# Patient Record
Sex: Female | Born: 1960
Health system: Southern US, Community
[De-identification: ages and names within clinical notes are randomized; demographics above are authoritative.]

## PROBLEM LIST (undated history)

## (undated) DIAGNOSIS — B019 Varicella without complication: Secondary | ICD-10-CM

## (undated) DIAGNOSIS — L859 Epidermal thickening, unspecified: Secondary | ICD-10-CM

## (undated) DIAGNOSIS — K449 Diaphragmatic hernia without obstruction or gangrene: Secondary | ICD-10-CM

## (undated) DIAGNOSIS — F329 Major depressive disorder, single episode, unspecified: Secondary | ICD-10-CM

## (undated) DIAGNOSIS — F411 Generalized anxiety disorder: Secondary | ICD-10-CM

## (undated) DIAGNOSIS — N390 Urinary tract infection, site not specified: Secondary | ICD-10-CM

## (undated) DIAGNOSIS — J349 Unspecified disorder of nose and nasal sinuses: Secondary | ICD-10-CM

## (undated) DIAGNOSIS — K635 Polyp of colon: Secondary | ICD-10-CM

## (undated) DIAGNOSIS — K219 Gastro-esophageal reflux disease without esophagitis: Secondary | ICD-10-CM

## (undated) DIAGNOSIS — T7840XA Allergy, unspecified, initial encounter: Secondary | ICD-10-CM

## (undated) DIAGNOSIS — J45909 Unspecified asthma, uncomplicated: Secondary | ICD-10-CM

## (undated) DIAGNOSIS — K5792 Diverticulitis of intestine, part unspecified, without perforation or abscess without bleeding: Secondary | ICD-10-CM

## (undated) DIAGNOSIS — J302 Other seasonal allergic rhinitis: Secondary | ICD-10-CM

## (undated) DIAGNOSIS — J301 Allergic rhinitis due to pollen: Secondary | ICD-10-CM

## (undated) DIAGNOSIS — N289 Disorder of kidney and ureter, unspecified: Secondary | ICD-10-CM

## (undated) DIAGNOSIS — M1990 Unspecified osteoarthritis, unspecified site: Secondary | ICD-10-CM

## (undated) DIAGNOSIS — I10 Essential (primary) hypertension: Secondary | ICD-10-CM

## (undated) DIAGNOSIS — E785 Hyperlipidemia, unspecified: Secondary | ICD-10-CM

## (undated) HISTORY — PX: UPPER GASTROINTESTINAL ENDOSCOPY: SHX188

## (undated) HISTORY — DX: Polyp of colon: K63.5

## (undated) HISTORY — PX: TOTAL ABDOMINAL HYSTERECTOMY: SHX209

## (undated) HISTORY — DX: Urinary tract infection, site not specified: N39.0

## (undated) HISTORY — PX: BILATERAL SALPINGOOPHORECTOMY: SHX1223

## (undated) HISTORY — DX: Unspecified disorder of nose and nasal sinuses: J34.9

## (undated) HISTORY — DX: Gastro-esophageal reflux disease without esophagitis: K21.9

## (undated) HISTORY — PX: MYOMECTOMY: SHX85

## (undated) HISTORY — DX: Major depressive disorder, single episode, unspecified: F32.9

## (undated) HISTORY — DX: Allergic rhinitis due to pollen: J30.1

## (undated) HISTORY — DX: Diaphragmatic hernia without obstruction or gangrene: K44.9

## (undated) HISTORY — DX: Epidermal thickening, unspecified: L85.9

## (undated) HISTORY — DX: Other seasonal allergic rhinitis: J30.2

## (undated) HISTORY — DX: Unspecified asthma, uncomplicated: J45.909

## (undated) HISTORY — DX: Allergy, unspecified, initial encounter: T78.40XA

## (undated) HISTORY — DX: Unspecified osteoarthritis, unspecified site: M19.90

## (undated) HISTORY — DX: Hyperlipidemia, unspecified: E78.5

## (undated) HISTORY — DX: Essential (primary) hypertension: I10

## (undated) HISTORY — DX: Generalized anxiety disorder: F41.1

## (undated) HISTORY — DX: Diverticulitis of intestine, part unspecified, without perforation or abscess without bleeding: K57.92

## (undated) HISTORY — DX: Varicella without complication: B01.9

---
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
## 2001-08-30 HISTORY — PX: TOTAL ABDOMINAL HYSTERECTOMY: SHX209

## 2012-08-30 HISTORY — PX: COLONOSCOPY: SHX174

## 2013-09-12 HISTORY — PX: LAPAROSCOPIC NISSEN FUNDOPLICATION: SHX1932

## 2016-12-30 LAB — HM COLONOSCOPY

## 2018-06-02 LAB — HM MAMMOGRAPHY

## 2018-07-17 LAB — LIPID PANEL
Chol/HDL Ratio: 2.8 (ref 0.0–4.4)
Cholesterol: 158 mg/dL (ref 100–200)
HDL: 57 mg/dL — ABNORMAL LOW (ref 65–96)
LDL Cholesterol: 59 mg/dL (ref 0.0–100.0)
LDL/HDL Ratio: 1
Triglycerides: 210 mg/dL — ABNORMAL HIGH (ref 0–149)
VLDL: 42 mg/dL — ABNORMAL HIGH (ref 5.0–40.0)

## 2018-07-17 LAB — TSH WITH REFLEX TO FT4: TSH: 2.66 mcIU/mL (ref 0.358–3.740)

## 2018-07-17 LAB — COMPREHENSIVE METABOLIC PANEL
ALT: 20 U/L (ref 0–33)
AST: 24 U/L (ref 0–32)
Albumin/Globulin Ratio: 2.3 mmol/L — ABNORMAL HIGH (ref 1.00–2.00)
Albumin: 4.8 g/dL (ref 3.5–5.2)
Alk Phosphatase: 114 U/L (ref 35–117)
Anion Gap: 9 mmol/L (ref 2–17)
BUN: 13 mg/dL (ref 6–20)
CO2: 30 mmol/L — ABNORMAL HIGH (ref 22–29)
Calcium: 10 mg/dL (ref 8.6–10.0)
Chloride: 103 mmol/L (ref 98–107)
Creatinine: 0.7 mg/dL (ref 0.5–0.9)
GFR African American: 113 mL/min/{1.73_m2} (ref >=90)
GFR Non-African American: 98 mL/min/{1.73_m2} (ref >=90)
Globulin: 2 g/dL (ref 1.9–4.4)
Glucose: 91 mg/dL (ref 70–99)
OSMOLALITY CALCULATED: 283 mOsm/kg (ref 270–287)
Potassium: 4.8 mmol/L (ref 3.5–5.3)
Sodium: 142 mmol/L (ref 135–145)
Total Bilirubin: 0.8 mg/dL (ref 0.00–1.20)
Total Protein: 6.9 g/dL (ref 6.4–8.3)

## 2018-07-18 LAB — CBC WITH AUTO DIFFERENTIAL
Absolute Eos #: 0.2 10*3/uL (ref 0.0–0.5)
Absolute Lymph #: 1.3 10*3/uL (ref 1.0–3.2)
Absolute Mono #: 0.5 10*3/uL (ref 0.3–1.0)
Basophils %: 0.8 % (ref 0.0–2.0)
Eosinophils %: 3.4 % (ref 0.0–7.0)
Hematocrit: 40.1 % (ref 34.0–47.0)
Hemoglobin: 13.6 g/dL (ref 11.5–15.7)
Lymphocytes: 20.2 % (ref 15.0–45.0)
MCH: 31.5 pg (ref 27.0–34.5)
MCHC: 33.9 g/dL (ref 32.0–36.0)
MCV: 93 fL (ref 81.0–99.0)
MPV: 8.6 fL (ref 7.2–13.2)
Monocytes: 7.2 % (ref 4.0–12.0)
Neutrophils %: 68.4 % (ref 42.0–74.0)
Neutrophils Absolute: 4.5 10*3/uL (ref 1.6–7.3)
Platelets: 242 10*3/uL (ref 140–440)
RBC: 4.32 x10e6/mcL (ref 3.60–5.20)
RDW: 14.3 % (ref 11.0–16.0)
WBC: 6.6 10*3/uL (ref 3.8–10.6)

## 2019-05-23 LAB — BASIC METABOLIC PANEL
Anion Gap: 12 mmol/L (ref 2–17)
BUN: 18 mg/dL (ref 6–20)
CO2: 27 mmol/L (ref 22–29)
Calcium: 9.3 mg/dL (ref 8.6–10.0)
Chloride: 101 mmol/L (ref 98–107)
Creatinine: 0.9 mg/dL (ref 0.5–0.9)
GFR African American: 83 mL/min/{1.73_m2} — ABNORMAL LOW (ref >=90)
GFR Non-African American: 71 mL/min/{1.73_m2} — ABNORMAL LOW (ref >=90)
Glucose: 124 mg/dL — ABNORMAL HIGH (ref 70–99)
OSMOLALITY CALCULATED: 281 mOsm/kg (ref 270–287)
Potassium: 4.3 mmol/L (ref 3.5–5.3)
Sodium: 139 mmol/L (ref 135–145)

## 2019-05-23 LAB — CBC WITH AUTO DIFFERENTIAL
Absolute Baso #: 0 10*3/uL (ref 0.0–0.2)
Absolute Eos #: 0.3 10*3/uL (ref 0.0–0.5)
Absolute Lymph #: 1.4 10*3/uL (ref 1.0–3.2)
Absolute Mono #: 0.5 10*3/uL (ref 0.3–1.0)
Basophils %: 0.4 % (ref 0.0–2.0)
Eosinophils %: 3.3 % (ref 0.0–7.0)
Hematocrit: 42.5 % (ref 34.0–47.0)
Hemoglobin: 14.2 g/dL (ref 11.5–15.7)
Immature Grans (Abs): 0.02 10*3/uL (ref 0.00–0.06)
Immature Granulocytes: 0.2 % (ref 0.1–0.6)
Lymphocytes: 16.8 % (ref 15.0–45.0)
MCH: 32.3 pg (ref 27.0–34.5)
MCHC: 33.4 g/dL (ref 32.0–36.0)
MCV: 96.6 fL (ref 81.0–99.0)
MPV: 9.6 fL (ref 7.2–13.2)
Monocytes: 5.9 % (ref 4.0–12.0)
Neutrophils %: 73.4 % (ref 42.0–74.0)
Neutrophils Absolute: 6.3 10*3/uL (ref 1.6–7.3)
Platelets: 218 10*3/uL (ref 140–440)
RBC: 4.4 x10e6/mcL (ref 3.60–5.20)
RDW: 12.8 % (ref 11.0–16.0)
WBC: 8.5 10*3/uL (ref 3.8–10.6)

## 2019-05-23 LAB — POC URINALYSIS, CHEMISTRY
Bilirubin, Urine, POC: NEGATIVE
Glucose, UA POC: NEGATIVE mg/dL
Ketones, Urine, POC: NEGATIVE mg/dL
Leukocytes, UA: NEGATIVE
Nitrate, UA POC: NEGATIVE
Protein, Urine, POC: NEGATIVE
Specific Gravity, Urine, POC: >=1.03 — AB (ref 1.003–1.035)
UROBILIN U POC: 0.2 EU/dL
pH, Urine, POC: 5.5 (ref 4.5–8.0)

## 2019-05-23 NOTE — ED Notes (Signed)
ED Triage Note       ED Secondary Triage Entered On:  05/23/2019 4:13 EDT    Performed On:  05/23/2019 4:12 EDT by Anthoney Harada, RN, Madeline               General Information   Barriers to Learning :   None evident   Languages :   Davey   ED Home Meds Section :   Document assessment   [place] ED Fall Risk Section :   Document assessment   ED Advance Directives Section :   Document assessment   ED Palliative Screen :   N/A (prefilled for <65yo)   Anthoney Harada, RN, Madeline - 05/23/2019 4:12 EDT   (As Of: 05/23/2019 04:13:50 EDT)   Problems(Active)    High cholesterol (SNOMED CT  :16109604 )  Name of Problem:   High cholesterol ; Recorder:   Anthoney Harada, RN, Madeline; Confirmation:   Confirmed ; Classification:   Patient Stated ; Code:   [id number] ; Contributor System:   Conservation officer, nature ; Last Updated:   05/23/2019 4:13 EDT ; Life Cycle Date:   05/23/2019 ; Life Cycle Status:   Active ; Vocabulary:   SNOMED CT        Kidney stones (SNOMED CT  :147829562 )  Name of Problem:   Kidney stones ; Recorder:   Anthoney Harada, RN, Watt Climes; Confirmation:   Confirmed ; Classification:   Patient Stated ; Code:   [id number] ; Contributor System:   Conservation officer, nature ; Last Updated:   05/23/2019 4:13 EDT ; Life Cycle Date:   05/23/2019 ; Life Cycle Status:   Active ; Vocabulary:   SNOMED CT          Diagnoses(Active)    Flank pain  Date:   05/23/2019 ; Diagnosis Type:   Reason For Visit ; Confirmation:   Complaint of ; Clinical Dx:   Flank pain ; Classification:   Medical ; Clinical Service:   Emergency medicine ; Code:   PNED ; Probability:   0 ; Diagnosis Code:   [id number]             -    Procedure History   (As Of: 05/23/2019 04:13:50 EDT)     Lennette Bihari Fall Risk Assessment Tool   Hx of falling last 3 months ED Fall :   No   Patient confused or disoriented ED Fall :   No   Patient intoxicated or sedated ED Fall :   No   Patient impaired gait ED Fall :   No   Use a mobility assistance device ED Fall :   No   Patient altered  elimination ED Fall :   No   [facility] ED Fall Score :   0    Anthoney Harada, RN, Madeline - 05/23/2019 4:12 EDT   ED Advance Directive   Advance Directive :   No   Anthoney Harada, RN, Madeline - 05/23/2019 4:12 EDT   Med Hx   Medication List   (As Of: 05/23/2019 04:13:51 EDT)   Normal Order    ketorolac 15 mg/mL Inj Soln 1 mL  :   ketorolac 15 mg/mL Inj Soln 1 mL ; Status:   Completed ; Ordered As Mnemonic:   Toradol ; Simple Display Line:   15 mg, 1 mL, IV Push, Once ; Ordering Provider:   Holli Humbles, MD, Erin Sons; Catalog Code:   ketorolac ; Order Dt/Tm:   05/23/2019 03:57:32 EDT          ondansetron 2 mg/mL Inj  Soln 2 mL  :   ondansetron 2 mg/mL Inj Soln 2 mL ; Status:   Completed ; Ordered As Mnemonic:   Zofran ; Simple Display Line:   4 mg, 2 mL, IV Push, Once ; Ordering Provider:   Carmel Sacramento, MD, Charlynn Court; Catalog Code:   ondansetron ; Order Dt/Tm:   05/23/2019 03:57:33 EDT          Sodium Chloride 0.9% intravenous solution Bolus  :   Sodium Chloride 0.9% intravenous solution Bolus ; Status:   Completed ; Ordered As Mnemonic:   Sodium Chloride 0.9% bolus ; Simple Display Line:   1,000 mL, 1000 mL/hr, IV Piggyback, Once ; Ordering Provider:   Carmel Sacramento, MD, Charlynn Court; Catalog Code:   Sodium Chloride 0.9% ; Order Dt/Tm:   05/23/2019 03:57:32 EDT            Home Meds    esomeprazole  :   esomeprazole ; Status:   Documented ; Ordered As Mnemonic:   esomeprazole 40 mg oral delayed release capsule ; Simple Display Line:   40 mg, 1 caps, Oral, Daily, 0 Refill(s) ; Catalog Code:   esomeprazole ; Order Dt/Tm:   05/23/2019 04:13:11 EDT          montelukast  :   montelukast ; Status:   Documented ; Ordered As Mnemonic:   montelukast 10 mg oral tablet ; Simple Display Line:   0 Refill(s) ; Catalog Code:   montelukast ; Order Dt/Tm:   05/23/2019 04:13:11 EDT          rosuvastatin  :   rosuvastatin ; Status:   Documented ; Ordered As Mnemonic:   rosuvastatin 20 mg oral tablet ; Simple Display Line:   20 mg, 1 tabs, Oral,  Daily, 0 Refill(s) ; Catalog Code:   rosuvastatin ; Order Dt/Tm:   05/23/2019 04:13:11 EDT          venlafaxine  :   venlafaxine ; Status:   Documented ; Ordered As Mnemonic:   venlafaxine 150 mg oral tablet, extended release ; Simple Display Line:   150 mg, 1 tabs, Oral, Daily, 30 tabs, 0 Refill(s) ; Catalog Code:   venlafaxine ; Order Dt/Tm:   05/23/2019 04:13:11 EDT ; Comment:   DO NOT CRUSH

## 2019-05-23 NOTE — ED Notes (Signed)
ED Patient Education Note     Patient Education Materials Follows:  Nephrology     Kidney Stones    Kidney stones (urolithiasis) are deposits that form inside your kidneys. The intense pain is caused by the stone moving through the urinary tract. When the stone moves, the ureter goes into spasm around the stone. The stone is usually passed in the urine.       CAUSES     A disorder that makes certain neck glands produce too much parathyroid hormone (primary hyperparathyroidism).      A buildup of uric acid crystals, similar to gout in your joints.     Narrowing (stricture) of the ureter.     A kidney obstruction present at birth (congenital obstruction).     Previous surgery on the kidney or ureters.     Numerous kidney infections.    SYMPTOMS     Feeling sick to your stomach (nauseous).      Throwing up (vomiting).     Blood in the urine (hematuria).     Pain that usually spreads (radiates) to the groin.     Frequency or urgency of urination.    DIAGNOSIS     Taking a history and physical exam.     Blood or urine tests.     CT scan.     Occasionally, an examination of the inside of the urinary bladder (cystoscopy) is performed.    TREATMENT     Observation.      Increasing your fluid intake.     Extracorporeal shock wave lithotripsy?This is a noninvasive procedure that uses shock waves to break up kidney stones.     Surgery may be needed if you have severe pain or persistent obstruction. There are various surgical procedures. Most of the procedures are performed with the use of small instruments. Only small incisions are needed to accommodate these instruments, so recovery time is minimized.    The size, location, and chemical composition are all important variables that will determine the proper choice of action for you. Talk to your health care provider to better understand your situation so that you will minimize the risk of injury to yourself and your kidney.     HOME CARE INSTRUCTIONS     Drink enough water and  fluids to keep your urine clear or pale yellow. This will help you to pass the stone or stone fragments.     Strain all urine through the provided strainer. Keep all particulate matter and stones for your health care provider to see. The stone causing the pain may be as small as a grain of salt. It is very important to use the strainer each and every time you pass your urine. The collection of your stone will allow your health care provider to analyze it and verify that a stone has actually passed. The stone analysis will often identify what you can do to reduce the incidence of recurrences.     Only take over-the-counter or prescription medicines for pain, discomfort, or fever as directed by your health care provider.     Keep all follow-up visits as told by your health care provider. This is important.     Get follow-up X-rays if required. The absence of pain does not always mean that the stone has passed. It may have only stopped moving. If the urine remains completely obstructed, it can cause loss of kidney function or even complete destruction of the kidney. It is your responsibility to make   sure X-rays and follow-ups are completed. Ultrasounds of the kidney can show blockages and the status of the kidney. Ultrasounds are not associated with any radiation and can be performed easily in a matter of minutes.     Make changes to your daily diet as told by your health care provider. You may be told to:     ? Limit the amount of salt that you eat.    ? Eat 5 or more servings of fruits and vegetables each day.     ? Limit the amount of meat, poultry, fish, and eggs that you eat.      Collect a 24-hour urine sample as told by your health care provider.?You may need to collect another urine sample every 6?12 months.     SEEK MEDICAL CARE IF:     You experience pain that is progressive and unresponsive to any pain medicine you have been prescribed.    SEEK IMMEDIATE MEDICAL CARE IF:     Pain cannot be controlled with  the prescribed medicine.     You have a fever or shaking chills.     The severity or intensity of pain increases over 18 hours and is not relieved by pain medicine.     You develop a new onset of abdominal pain.     You feel faint or pass out.     You are unable to urinate.    This information is not intended to replace advice given to you by your health care provider. Make sure you discuss any questions you have with your health care provider.    Document Released: 08/16/2005 Document Revised: 05/07/2015 Document Reviewed: 01/17/2013  Elsevier Interactive Patient Education ?2016 [employer] Inc[employer]

## 2019-05-23 NOTE — Discharge Summary (Signed)
 ED Clinical Summary                         [facility]  [address]  [place], [place] [address]  [phone]           PERSON INFORMATION  Name: Rose Collins, Rose Collins Age:  58 Years DOB: [birth year]   Sex: Female Language: English PCP: LAFAYE NO   Marital Status:  Married Phone: [phone] Med Service: MED-Medicine   MRN:  [id number] Acct# [id number] Arrival: 05/23/2019 03:42:00   Visit Reason: Flank pain; LOWER ABD PAIN Acuity: 3 LOS: 000 01:35   Address:      [address] [place] [address]  Diagnosis:      Ureteral colic  Printed Prescriptions:            Allergies      penicillin (Unknown)      clindamycin (Vomiting)      morphine (Hives) (Mild)      Medications Administered During Visit:                  Medication Dose Route   Sodium Chloride 0.9% 1000 mL IV Piggyback   ketorolac 15 mg IV Push   ondansetron 4 mg IV Push   promethazine 12.5 mg IV Push       Patient Medication List:              esomeprazole (esomeprazole 40 mg oral delayed release capsule) 1 Capsules Oral (given by mouth) every day.  ibuprofen (ibuprofen 600 mg oral tablet) 1 Tabs Oral (given by mouth) 3 times a day as needed moderate pain (4-7) for 5 Days. Refills: 0.  montelukast (montelukast 10 mg oral tablet)  ondansetron (Zofran 4 mg oral tablet) 1 Tabs Oral (given by mouth) every 4 hours as needed nausea for 3 Days. Refills: 0.  rosuvastatin (rosuvastatin 20 mg oral tablet) 1 Tabs Oral (given by mouth) every day.  venlafaxine (venlafaxine 150 mg oral tablet, extended release) 1 Tabs Oral (given by mouth) every day., DO NOT CRUSH         Major Tests and Procedures:  The following procedures and tests were performed during your ED visit.  COMMONPROCEDURES%>  COMMON PROCEDURESCOMMENTS%>          Laboratory Orders  Name Status Details   .UA POC Completed Urine, RT, RT - Routine, Collected, 05/23/19 3:59:00 EDT, Nurse collect, 05/23/19 3:59:00 US Robinette, RAL POC Login   BMP Completed  Blood, Stat, ST - Stat, 05/23/19 3:57:00 EDT, 05/23/19 3:57:00 EDT, Nurse collect, SMITH-MD, MD, MAUDE MANGO, Print label Y/N   CBCDIFF Completed Blood, Stat, ST - Stat, 05/23/19 3:57:00 EDT, 05/23/19 3:57:00 EDT, Nurse collect, GENNY, MD, MAUDE MANGO, Print label Y/N               Radiology Orders  Name Status Details   CT Abdomen Pelvis w/o Contrast Ordered 05/23/19 3:57:00 EDT, STAT 1 hour or less, Reason: Flank pain, stone disease suspected, Stone Protocol, Transport Mode: STRETCHER, pp_set_radiology_subspecialty, [id number], 8               Patient Care Orders  Name Status Details   Discharge Patient Ordered 05/23/19 4:44:00 EDT   ED Assessment Adult Completed 05/23/19 3:53:08 EDT, 05/23/19 3:53:08 EDT   ED Secondary Triage Completed 05/23/19 3:53:08 EDT, 05/23/19 3:53:08 EDT   ED Triage Adult Completed 05/23/19 3:42:25 EDT, 05/23/19 3:42:25 EDT   POC-Urine Dipstick  collect Completed 05/23/19 3:57:00 EDT, Once, 05/23/19 3:57:00 EDT   Saline Lock Insert Completed 05/23/19 3:57:00 EDT, Once, 05/23/19 3:57:00 EDT   Strain All Urine Completed 05/23/19 3:57:00 EDT, Once, 05/23/19 3:57:00 EDT   Treatment Instruction Completed 05/23/19 3:57:00 EDT, Once, 05/23/19 3:57:00 EDT, Urine strainer and containers with pt teaching             PROVIDER INFORMATION               Provider Role Assigned Sampson LOCUST, MD, MAUDE MANGO ED Provider 05/23/2019 03:44:28    Keane, RN, Madeline ED Nurse 05/23/2019 03:50:04        Attending Physician:  LOCUST, MD, MAUDE MANGO     Admit Doc  SMITH-MD, MD, MAUDE MANGO     Consulting Doc       VITALS INFORMATION  Vital Sign Triage Latest   Temp Oral ORAL_1%>36.6 degC ORAL%>36.6 degC   Temp Temporal TEMPORAL_1%> TEMPORAL%>   Temp Intravascular INTRAVASCULAR_1%> INTRAVASCULAR%>   Temp Axillary AXILLARY_1%> AXILLARY%>   Temp Rectal RECTAL_1%> RECTAL%>   02 Sat 98 % 93 %   Respiratory Rate RATE_1%>20 br/min RATE%>17 br/min   Peripheral Pulse Rate PULSE RATE_1%>  PULSE RATE%>   Apical Heart Rate HEART RATE_1%> HEART RATE%>   Blood Pressure BLOOD PRESSURE_1%>/ BLOOD PRESSURE_1%>89 mmHg BLOOD PRESSURE%>141 mmHg / BLOOD PRESSURE%>84 mmHg                 Immunizations      No Immunizations Documented This Visit          DISCHARGE INFORMATION   Discharge Disposition: H Outpt-Sent Home   Discharge Location:    Home   Discharge Date and Time:    05/23/2019 05:17:39   ED Checkout Date and Time:    05/23/2019 05:17:39     DEPART REASON INCOMPLETE INFORMATION               Depart Action Incomplete Reason   Interactive View/I&O Recently assessed               Problems      No Problems Documented              Smoking Status      No Smoking Status Documented         PATIENT EDUCATION INFORMATION  Instructions:       Kidney Stones     Follow up:                    With: Address: When:   DENNIS KUBINSKI-MD [address], [address] [place] [address]  [phone] Business (1) Within 1 to 2 days   Comments:   Please return to the Emergency Department with any worsening or persistent symptoms. Please follow-up with recommended physician in the time frame listed. Please call with any problems or concerns. If you have no primary care doctor, please call [phone] to establish care with one. Thank you for choosing [facility]!              ED PROVIDER DOCUMENTATION     Patient:   Rose Collins            MRN: [id number]            FIN: [id number]               Age:   58 years     Sex:  Female     DOB:  Jan 02, 1961  Associated Diagnoses:   Ureteral colic   Author:   GENNY, MD, MAUDE MANGO      Basic Information   Time seen: Provider Seen (ST)   ED Provider/Time:    GENNY, MD, MAUDE MANGO / 05/23/2019 03:44  .   Additional information: Chief Complaint from Nursing Triage Note   Chief Complaint  Chief Complaint: Patient woke upat 0100 with R flank/abd pain, pt has hx of kidney stones. Nausea without vomiting. Denies fevers. (05/23/19 03:50:00).      History of Present Illness   This  is a 58 year old female with history of kidney stones who presents for evaluation of right flank pain.  This started at about 1 AM and awoke her from sleep.  She has had some nausea without any vomiting.  Reports some urinary frequency but no dysuria or hematuria.  She is never required any sort of urologic intervention for kidney stones in the past.  She does not see a urologist.  She has not tried anything for pain.  Says this feels like kidney stones in the past.      Review of Systems   Gastrointestinal symptoms:  Right flank, nausea.              Additional review of systems information: All other systems reviewed and otherwise negative.      Health Status   Allergies:    Allergic Reactions (Selected)  Moderate  Clindamycin- Vomiting.  Mild  Morphine- Hives and mild.  Penicillin- Unknown..   Medications:  (Selected)   Inpatient Medications  Ordered  Sodium Chloride 0.9% bolus: 1,000 mL, 1000 mL/hr, IV Piggyback, Once  Toradol: 15 mg, 1 mL, IV Push, Once  Zofran: 4 mg, 2 mL, IV Push, Once.      Past Medical/ Family/ Social History   Medical history: Reviewed as documented in chart.   Surgical history: Reviewed as documented in chart.   Family history: Not significant.   Social history: Reviewed as documented in chart.   Problem list:    No qualifying data available  , per nurse's notes.      Physical Examination               Vital Signs   Vital Signs   05/23/2019 3:50 EDT Systolic Blood Pressure 167 mmHg  HI    Diastolic Blood Pressure 89 mmHg    Temperature Oral 36.6 degC    Heart Rate Monitored 89 bpm    Respiratory Rate 20 br/min    SpO2 98 %   .   Measurements   05/23/2019 3:53 EDT Body Mass Index est meas 36.72 kg/m2    Body Mass Index Measured 36.72 kg/m2   05/23/2019 3:50 EDT Height/Length Measured 157 cm    Weight Dosing 90.5 kg   .   Basic Oxygen Information   05/23/2019 3:50 EDT SpO2 98 %    Oxygen Therapy Room air   .   General:  Alert, no acute distress.    Skin:  Warm, dry, pink, intact.    Head:   Normocephalic, atraumatic.    Neck:  Supple, trachea midline.    Eye:  Pupils are equal, round and reactive to light, extraocular movements are intact, normal conjunctiva.    Cardiovascular:  Regular rate and rhythm, Normal peripheral perfusion, No edema.    Respiratory:  Respirations are non-labored, Symmetrical chest wall expansion.    Gastrointestinal:  Soft, Non distended, Tenderness: Mild, right flank, Guarding: Negative, Rebound: Negative.    Musculoskeletal:  Normal ROM, no swelling.    Neurological:  Alert and oriented to person, place, time, and situation, No focal neurological deficit observed.       Medical Decision Making   Differential Diagnosis:  Ureteral stone, urinary tract infection, diverticulitis, constipation.    Documents reviewed:  Emergency department nurses' notes, emergency department records, prior records.    Results review:  Lab results : Lab View   05/23/2019 4:17 EDT Estimated Creatinine Clearance 72.72 mL/min   05/23/2019 4:00 EDT WBC 8.5 x10e3/mcL    RBC 4.40 x10e6/mcL    Hgb 14.2 g/dL    HCT 57.4 %    MCV 03.3 fL    MCH 32.3 pg    MCHC 33.4 g/dL    RDW 87.1 %    Platelet 218 x10e3/mcL    MPV 9.6 fL    Neutro Auto 73.4 %    Neutro Absolute 6.3 x10e3/mcL    Immature Grans Percent 0.2 %    Immature Grans Absolute 0.02 x10e3/mcL    Lymph Auto 16.8 %    Lymph Absolute 1.4 x10e3/mcL    Mono Auto 5.9 %    Mono Absolute 0.5 x10e3/mcL    Eosinophil Percent 3.3 %    Eos Absolute 0.3 x10e3/mcL    Basophil Auto 0.4 %    Baso Absolute 0.0 x10e3/mcL    Sodium Lvl 139 mmol/L    Potassium Lvl 4.3 mmol/L    Chloride 101 mmol/L    CO2 27 mmol/L    Glucose Random 124 mg/dL  HI    BUN 18 mg/dL    Creatinine Lvl 0.9 mg/dL    AGAP 12 mmol/L    Osmolality Calc 281 mOsm/kg    Calcium Lvl 9.3 mg/dL    eGFR AA 83 fO/fpw/8.26f  LOW    eGFR Non-AA 71 mL/min/1.63m  LOW   05/23/2019 3:59 EDT Appear U POC Clear    Color U POC Yellow    Bili U POC Negative    Blood U POC Large    Glucose U POC Negative mg/dL     Ketones U POC Negative mg/dL    Leuk Est U POC Negative    Nitrite U POC Negative    pH U POC 5.5    Protein U POC Negative    Spec Grav U POC >=1.030    Urobilin U POC 0.2 EU/dL   0/76/7979 6:46 EDT Estimated Creatinine Clearance 93.50 mL/min   .   Radiology results:  Discussed with radiologist, interpretation:  CT kidney stone protocol discussed with vision radiology.  There is a 3 mm stone at the ureteral orifice without hydronephrosis..       Reexamination/ Reevaluation   The patient feels completely better on reassessment.  She has no pain, although I left the room, and she told the nurse she felt nauseous.  Do not suspect UTI.  Have advised follow-up with urology.      Impression and Plan   Diagnosis   Ureteral colic (ICD10-CM N23, Discharge, Medical)   Plan   Condition: Improved.    Disposition: Medically cleared, Discharged: Time  05/23/2019 04:43:00, to home.    Prescriptions: Launch prescriptions   Pharmacy:  Zofran 4 mg oral tablet (Prescribe): 4 mg, 1 tabs, Oral, q4hr, for 3 days, PRN: nausea, 12 tabs, 0 Refill(s)  ibuprofen 600 mg oral tablet (Prescribe): 600 mg, 1 tabs, Oral, TID, for 5 days, PRN: moderate pain (4-7), 15 tabs, 0 Refill(s).    Patient was given the following educational materials:  Kidney Stones.    Follow up with: DENNIS KUBINSKI-MD Within 1 to 2 days Please return to the Emergency Department with any worsening or persistent symptoms. Please follow-up with recommended physician in the time frame listed. Please call with any problems or concerns. If you have no primary care doctor, please call [phone] to establish care with one. Thank you for choosing [facility]!  SABRA    Counseled: I had a detailed discussion with the patient and/or guardian regarding the historical points/exam findings supporting the discharge diagnosis and need for outpatient followup. Discussed the need to return to the ER if symptoms persist/worsen, or for any questions/concerns that arise at home.

## 2019-05-23 NOTE — ED Notes (Signed)
ED Triage Note       ED Triage Adult Entered On:  05/23/2019 3:53 EDT    Performed On:  05/23/2019 3:50 EDT by Jacques Navy, RN, Madeline               Triage   Chief Complaint :   Patient woke upat 0100 with R flank/abd pain, pt has hx of kidney stones. Nausea without vomiting. Denies fevers.    Chief Complaint Onset :   05/23/2019 1:00 EDT   Numeric Rating Pain Scale :   10 = Worst possible pain   [place] Mode of Arrival :   Private vehicle   Infectious Disease Documentation :   Document assessment   Patient received chemo or biotherapy last 48 hrs? :   No   Temperature Oral :   36.6 degC(Converted to: 97.9 degF)    Heart Rate Monitored :   89 bpm   Respiratory Rate :   20 br/min   Systolic Blood Pressure :   167 mmHg (HI)    Diastolic Blood Pressure :   89 mmHg   SpO2 :   98 %   Oxygen Therapy :   Room air   Patient presentation :   None of the above   Chief Complaint or Presentation suggest infection :   No   Weight Dosing :   90.5 kg(Converted to: 199 lb 8 oz)    Height :   157 cm(Converted to: 5 ft 2 in)    Body Mass Index Dosing :   37 kg/m2   Jacques Navy, RN, Madeline - 05/23/2019 3:50 EDT   DCP GENERIC CODE   Tracking Acuity :   3   Tracking Group :   ED [employer]   Berthold, Public house manager - 05/23/2019 3:50 EDT   ED General Section :   Document assessment   Pregnancy Status :   Patient denies   ED Allergies Section :   Document assessment   ED Reason for Visit Section :   Document assessment   ED Quick Assessment :   Patient appears awake, alert, oriented to baseline. Skin warm and dry. Moves all extremities. Respiration even and unlabored. Appears in no apparent distress.   Arnoldussen, RN, Madeline - 05/23/2019 3:50 EDT   ID Risk Screen Symptoms   Recent Travel History :   No recent travel   Close Contact with COVID-19 ID :   No   Last 14 days COVID-19 ID :   No   TB Symptom Screen :   No symptoms   C. diff Symptom/History ID :   Neither of the above   Arnoldussen, RN, Madeline - 05/23/2019 3:50  EDT   Allergies   (As Of: 05/23/2019 03:53:07 EDT)   Allergies (Active)   clindamycin  Estimated Onset Date:   Unspecified ; Reactions:   Vomiting ; Created By:   Jacques Navy RN, Madeline; Reaction Status:   Active ; Category:   Drug ; Substance:   clindamycin ; Type:   Allergy ; Severity:   Moderate ; Updated By:   Jacques Navy RN, Madeline; Reviewed Date:   05/23/2019 3:52 EDT      morphine  Estimated Onset Date:   Unspecified ; Reactions:   Mild, Hives ; Created By:   Jacques Navy RN, Madeline; Reaction Status:   Active ; Category:   Drug ; Substance:   morphine ; Type:   Allergy ; Severity:   Mild ; Updated By:   Jacques Navy RN, Sheran Lawless;  Reviewed Date:   05/23/2019 3:52 EDT      penicillin  Estimated Onset Date:   Unspecified ; Reactions:   Unknown ; Created By:   Anthoney Harada RN, Madeline; Reaction Status:   Active ; Category:   Drug ; Substance:   penicillin ; Type:   Allergy ; Severity:   Mild ; Updated By:   Anthoney Harada RN, Watt Climes; Reviewed Date:   05/23/2019 3:52 EDT        Psycho-Social   Last 3 mo, thoughts killing self/others :   Patient denies   Anastasio Champion - 05/23/2019 3:50 EDT   ED Reason for Visit   (As Of: 05/23/2019 03:53:07 EDT)   Diagnoses(Active)    Flank pain  Date:   05/23/2019 ; Diagnosis Type:   Reason For Visit ; Confirmation:   Complaint of ; Clinical Dx:   Flank pain ; Classification:   Medical ; Clinical Service:   Emergency medicine ; Code:   PNED ; Probability:   0 ; Diagnosis Code:   [id number]

## 2019-05-23 NOTE — ED Provider Notes (Signed)
Flank pain        Patient:   Rose Collins, Rose Collins            MRN: [id number]            FIN: [id number]               Age:   58 years     Sex:  Female     DOB:  [birth year]   Associated Diagnoses:   Ureteral colic   Author:   Holli Humbles, MD, Erin Sons      Basic Information   Time seen: Provider Seen (ST)   ED Provider/Time:    Holli Humbles, MD, Erin Sons / 05/23/2019 03:44  .   Additional information: Chief Complaint from Nursing Triage Note   Chief Complaint  Chief Complaint: Patient woke upat 0100 with R flank/abd pain, pt has hx of kidney stones. Nausea without vomiting. Denies fevers. (05/23/19 03:50:00).      History of Present Illness   This is a 58 year old female with history of kidney stones who presents for evaluation of right flank pain.  This started at about 1 AM and awoke her from sleep.  She has had some nausea without any vomiting.  Reports some urinary frequency but no dysuria or hematuria.  She is never required any sort of urologic intervention for kidney stones in the past.  She does not see a urologist.  She has not tried anything for pain.  Says this feels like kidney stones in the past.      Review of Systems   Gastrointestinal symptoms:  Right flank, nausea.              Additional review of systems information: All other systems reviewed and otherwise negative.      Health Status   Allergies:    Allergic Reactions (Selected)  Moderate  Clindamycin- Vomiting.  Mild  Morphine- Hives and mild.  Penicillin- Unknown..   Medications:  (Selected)   Inpatient Medications  Ordered  Sodium Chloride 0.9% bolus: 1,000 mL, 1000 mL/hr, IV Piggyback, Once  Toradol: 15 mg, 1 mL, IV Push, Once  Zofran: 4 mg, 2 mL, IV Push, Once.      Past Medical/ Family/ Social History   Medical history: Reviewed as documented in chart.   Surgical history: Reviewed as documented in chart.   Family history: Not significant.   Social history: Reviewed as documented in chart.   Problem list:    No qualifying data available  , per  nurse's notes.      Physical Examination               Vital Signs   Vital Signs   4/81/8563 1:49 EDT Systolic Blood Pressure 702 mmHg  HI    Diastolic Blood Pressure 89 mmHg    Temperature Oral 36.6 degC    Heart Rate Monitored 89 bpm    Respiratory Rate 20 br/min    SpO2 98 %   .   Measurements   05/23/2019 3:53 EDT Body Mass Index est meas 36.72 kg/m2    Body Mass Index Measured 36.72 kg/m2   05/23/2019 3:50 EDT Height/Length Measured 157 cm    Weight Dosing 90.5 kg   .   Basic Oxygen Information   05/23/2019 3:50 EDT SpO2 98 %    Oxygen Therapy Room air   .   General:  Alert, no acute distress.    Skin:  Warm, dry, pink, intact.    Head:  Normocephalic,  atraumatic.    Neck:  Supple, trachea midline.    Eye:  Pupils are equal, round and reactive to light, extraocular movements are intact, normal conjunctiva.    Cardiovascular:  Regular rate and rhythm, Normal peripheral perfusion, No edema.    Respiratory:  Respirations are non-labored, Symmetrical chest wall expansion.    Gastrointestinal:  Soft, Non distended, Tenderness: Mild, right flank, Guarding: Negative, Rebound: Negative.    Musculoskeletal:  Normal ROM, no swelling.    Neurological:  Alert and oriented to person, place, time, and situation, No focal neurological deficit observed.       Medical Decision Making   Differential Diagnosis:  Ureteral stone, urinary tract infection, diverticulitis, constipation.    Documents reviewed:  Emergency department nurses' notes, emergency department records, prior records.    Results review:  Lab results : Lab View   05/23/2019 4:17 EDT Estimated Creatinine Clearance 72.72 mL/min   05/23/2019 4:00 EDT WBC 8.5 x10e3/mcL    RBC 4.40 x10e6/mcL    Hgb 14.2 g/dL    HCT 42.5 %    MCV 96.6 fL    MCH 32.3 pg    MCHC 33.4 g/dL    RDW 12.8 %    Platelet 218 x10e3/mcL    MPV 9.6 fL    Neutro Auto 73.4 %    Neutro Absolute 6.3 x10e3/mcL    Immature Grans Percent 0.2 %    Immature Grans Absolute 0.02 x10e3/mcL    Lymph Auto 16.8 %     Lymph Absolute 1.4 x10e3/mcL    Mono Auto 5.9 %    Mono Absolute 0.5 x10e3/mcL    Eosinophil Percent 3.3 %    Eos Absolute 0.3 x10e3/mcL    Basophil Auto 0.4 %    Baso Absolute 0.0 x10e3/mcL    Sodium Lvl 139 mmol/L    Potassium Lvl 4.3 mmol/L    Chloride 101 mmol/L    CO2 27 mmol/L    Glucose Random 124 mg/dL  HI    BUN 18 mg/dL    Creatinine Lvl 0.9 mg/dL    AGAP 12 mmol/L    Osmolality Calc 281 mOsm/kg    Calcium Lvl 9.3 mg/dL    eGFR AA 83 mL/min/1.66m???  LOW    eGFR Non-AA 71 mL/min/1.753m??  LOW   05/23/2019 3:59 EDT Appear U POC Clear    Color U POC Yellow    Bili U POC Negative    Blood U POC Large    Glucose U POC Negative mg/dL    Ketones U POC Negative mg/dL    Leuk Est U POC Negative    Nitrite U POC Negative    pH U POC 5.5    Protein U POC Negative    Spec Grav U POC >=1.030    Urobilin U POC 0.2 EU/dL   05/23/2019 3:53 EDT Estimated Creatinine Clearance 93.50 mL/min   .   Radiology results:  Discussed with radiologist, interpretation:  CT kidney stone protocol discussed with [facility].  There is a 3 mm stone at the ureteral orifice without hydronephrosis..       Reexamination/ Reevaluation   The patient feels completely better on reassessment.  She has no pain, although I left the room, and she told the nurse she felt nauseous.  Do not suspect UTI.  Have advised follow-up with urology.      Impression and Plan   Diagnosis   Ureteral colic ([id number], Medical)   Plan   Condition: Improved.  Disposition: Medically cleared, Discharged: Time  05/23/2019 04:43:00, to home.    Prescriptions: [facility] prescriptions   Pharmacy:  Zofran 4 mg oral tablet (Prescribe): 4 mg, 1 tabs, Oral, q4hr, for 3 days, PRN: nausea, 12 tabs, 0 Refill(s)  ibuprofen 600 mg oral tablet (Prescribe): 600 mg, 1 tabs, Oral, TID, for 5 days, PRN: moderate pain (4-7), 15 tabs, 0 Refill(s).    Patient was given the following educational materials: Kidney Stones.    Follow up with: DENNIS KUBINSKI-MD Within 1 to 2 days  Please return to the Emergency Department with any worsening or persistent symptoms. Please follow-up with recommended physician in the time frame listed. Please call with any problems or concerns. If you have no primary care doctor, please call [phone] to establish care with one. Thank you for choosing [facility] ER!  Marland Kitchen    Counseled: I had a detailed discussion with the patient and/or guardian regarding the historical points/exam findings supporting the discharge diagnosis and need for outpatient followup. Discussed the need to return to the ER if symptoms persist/worsen, or for any questions/concerns that arise at home.    Signature Line     Electronically Signed on 05/23/2019 04:44 AM EDT   ________________________________________________   Holli Humbles, MD, Erin Sons               Modified by: Holli Humbles, MD, Erin Sons on 05/23/2019 04:44 AM EDT

## 2019-05-23 NOTE — ED Notes (Signed)
 ED Patient Summary       ;          [facility]  [address] [place], [place] [address]  [phone]  Discharge Instructions (Patient)  _______________________________________     Rose Collins  DOB:  [birth year]                   MRN: [id number]                   FIN: [id number]  Reason For Visit: Flank pain; LOWER ABD PAIN  Final Diagnosis: Ureteral colic     Visit Date: 05/23/2019 03:42:00  Address: [address] [place] [address]  Phone: [phone]     Primary Care Provider:      Name: LAFAYE NO      Phone: [phone]        Emergency Department Providers:         Primary Physician:   GENNY MD, MAUDE BAIRD [facility] would like to thank you for allowing us  to assist you with your healthcare needs. The following includes patient education materials and information regarding your injury/illness.     Follow-up Instructions:  You were treated today on an emergency basis. If instructed, please contact your primary care provider to arrange for follow-up and for any further concerns. Whether you have been referred to your primary care doctor or a specialist, please follow-up as instructed.      If you do not have a doctor, you may call [phone] for assistance with finding a [facility] primary care physician or specialist. Staff is available to help schedule you an appointment.      Not sure where to go with questions about your health? We're here for you. The [facility] "Ask a Nurse" Line in staffed by experienced nurses and is a free service to the community, available Monday - Friday from 8AM to 5PM. Call [phone].      If your condition worsens before your follow-up with an outpatient physician, please return to the Emergency Department.              With: Address: When:   DENNIS KUBINSKI-MD [address], [address] [place] [address]  [phone] Business (1)  Within 1 to 2 days   Comments:   Please return to the Emergency Department with any worsening or persistent symptoms. Please follow-up with recommended physician in the time frame listed. Please call with any problems or concerns. If you have no primary care doctor, please call [phone] to establish care with one. Thank you for choosing [facility] ER!                 Printed Prescriptions:    Patient Education Materials:  Discharge Orders          Discharge Patient 05/23/19 4:44:00 EDT         Comment:      Kidney Stones     Kidney Stones    Kidney stones (urolithiasis) are deposits that form inside your kidneys. The intense pain is caused by the stone moving through the urinary tract. When the stone moves, the ureter goes into spasm around the stone. The stone is usually passed in the urine.       CAUSES     A disorder that makes certain neck glands produce  too much parathyroid hormone (primary hyperparathyroidism).      A buildup of uric acid crystals, similar to gout in your joints.     Narrowing (stricture) of the ureter.     A kidney obstruction present at birth (congenital obstruction).     Previous surgery on the kidney or ureters.     Numerous kidney infections.    SYMPTOMS     Feeling sick to your stomach (nauseous).      Throwing up (vomiting).     Blood in the urine (hematuria).     Pain that usually spreads (radiates) to the groin.     Frequency or urgency of urination.    DIAGNOSIS     Taking a history and physical exam.     Blood or urine tests.     CT scan.     Occasionally, an examination of the inside of the urinary bladder (cystoscopy) is performed.    TREATMENT     Observation.      Increasing your fluid intake.     Extracorporeal shock wave lithotripsy?This is a noninvasive procedure that uses shock waves to break up kidney stones.     Surgery may be needed if you have severe pain or persistent obstruction. There are various surgical procedures. Most of the procedures are performed with the use of  small instruments. Only small incisions are needed to accommodate these instruments, so recovery time is minimized.    The size, location, and chemical composition are all important variables that will determine the proper choice of action for you. Talk to your health care provider to better understand your situation so that you will minimize the risk of injury to yourself and your kidney.     HOME CARE INSTRUCTIONS     Drink enough water and fluids to keep your urine clear or pale yellow. This will help you to pass the stone or stone fragments.     Strain all urine through the provided strainer. Keep all particulate matter and stones for your health care provider to see. The stone causing the pain may be as small as a grain of salt. It is very important to use the strainer each and every time you pass your urine. The collection of your stone will allow your health care provider to analyze it and verify that a stone has actually passed. The stone analysis will often identify what you can do to reduce the incidence of recurrences.     Only take over-the-counter or prescription medicines for pain, discomfort, or fever as directed by your health care provider.     Keep all follow-up visits as told by your health care provider. This is important.     Get follow-up X-rays if required. The absence of pain does not always mean that the stone has passed. It may have only stopped moving. If the urine remains completely obstructed, it can cause loss of kidney function or even complete destruction of the kidney. It is your responsibility to make sure X-rays and follow-ups are completed. Ultrasounds of the kidney can show blockages and the status of the kidney. Ultrasounds are not associated with any radiation and can be performed easily in a matter of minutes.     Make changes to your daily diet as told by your health care provider. You may be told to:     ? Limit the amount of salt that you eat.    ? Eat 5 or more servings of  fruits and vegetables  each day.     ? Limit the amount of meat, poultry, fish, and eggs that you eat.      Collect a 24-hour urine sample as told by your health care provider.?You may need to collect another urine sample every 6?12 months.     SEEK MEDICAL CARE IF:     You experience pain that is progressive and unresponsive to any pain medicine you have been prescribed.    SEEK IMMEDIATE MEDICAL CARE IF:     Pain cannot be controlled with the prescribed medicine.     You have a fever or shaking chills.     The severity or intensity of pain increases over 18 hours and is not relieved by pain medicine.     You develop a new onset of abdominal pain.     You feel faint or pass out.     You are unable to urinate.    This information is not intended to replace advice given to you by your health care provider. Make sure you discuss any questions you have with your health care provider.    Document Released: 08/16/2005 Document Revised: 05/07/2015 Document Reviewed: 01/17/2013  Elsevier Interactive Patient Education ?2016 [employer]         Allergy Info: morphine; penicillin; clindamycin     Medication Information:  [facility] ED Physicians provided you with a complete list of medications post discharge, if you have been instructed to stop taking a medication please ensure you also follow up with this information to your Primary Care Physician.  Unless otherwise noted, patient will continue to take medications as prescribed prior to the Emergency Room visit.  Any specific questions regarding your chronic medications and dosages should be discussed with your physician(s) and pharmacist.          esomeprazole (esomeprazole 40 mg oral delayed release capsule) 1 Capsules Oral (given by mouth) every day.  ibuprofen (ibuprofen 600 mg oral tablet) 1 Tabs Oral (given by mouth) 3 times a day as needed moderate pain (4-7) for 5 Days. Refills: 0.  montelukast (montelukast 10 mg oral tablet)  ondansetron  (Zofran 4 mg oral tablet) 1 Tabs Oral (given by mouth) every 4 hours as needed nausea for 3 Days. Refills: 0.  rosuvastatin (rosuvastatin 20 mg oral tablet) 1 Tabs Oral (given by mouth) every day.  venlafaxine (venlafaxine 150 mg oral tablet, extended release) 1 Tabs Oral (given by mouth) every day., DO NOT CRUSH      Medications Administered During Visit:              Medication Dose Route   Sodium Chloride 0.9% 1000 mL IV Piggyback   ketorolac 15 mg IV Push   ondansetron 4 mg IV Push   promethazine 12.5 mg IV Push          Major Tests and Procedures:  The following procedures and tests were performed during your ED visit.  PROCEDURES%>  PROCEDURES COMMENTS%>          Laboratory Orders  Name Status Details   .UA POC Completed Urine, RT, RT - Routine, Collected, 05/23/19 3:59:00 EDT, Nurse collect, 05/23/19 3:59:00 US Robinette, RAL POC Login   BMP Completed Blood, Stat, ST - Stat, 05/23/19 3:57:00 EDT, 05/23/19 3:57:00 EDT, Nurse collect, GENNY, MD, MAUDE MANGO, Print label Y/N   CBCDIFF Completed Blood, Stat, ST - Stat, 05/23/19 3:57:00 EDT, 05/23/19 3:57:00 EDT, Nurse collect, GENNY, MD, MAUDE MANGO, Print label Y/N  Radiology Orders  Name Status Details   CT Abdomen Pelvis w/o Contrast Ordered 05/23/19 3:57:00 EDT, STAT 1 hour or less, Reason: Flank pain, stone disease suspected, Stone Protocol, Transport Mode: STRETCHER, pp_set_radiology_subspecialty, [id number], 8               Patient Care Orders  Name Status Details   Discharge Patient Ordered 05/23/19 4:44:00 EDT   ED Assessment Adult Completed 05/23/19 3:53:08 EDT, 05/23/19 3:53:08 EDT   ED Secondary Triage Completed 05/23/19 3:53:08 EDT, 05/23/19 3:53:08 EDT   ED Triage Adult Completed 05/23/19 3:42:25 EDT, 05/23/19 3:42:25 EDT   POC-Urine Dipstick collect Completed 05/23/19 3:57:00 EDT, Once, 05/23/19 3:57:00 EDT   Saline Lock Insert Completed 05/23/19 3:57:00 EDT, Once, 05/23/19 3:57:00 EDT   Strain All Urine Completed 05/23/19  3:57:00 EDT, Once, 05/23/19 3:57:00 EDT   Treatment Instruction Completed 05/23/19 3:57:00 EDT, Once, 05/23/19 3:57:00 EDT, Urine strainer and containers with pt teaching       ---------------------------------------------------------------------------------------------------------------------  [facility]) encourages you to self-enroll in the [facility] Patient Portal.  [facility] Patient Portal will allow you to manage your personal health information securely from your own electronic device now and in the future.  To begin your Patient Portal enrollment process, please visit [email or web address] Click on "Sign up now" under [facility].  If you find that you need additional assistance on the [facility] Patient Portal or need a copy of your medical records, please call the [facility] Medical Records Office at [phone].  Comment:

## 2019-08-10 LAB — CBC: RBC: 4.41 (ref 3.87–5.11)

## 2019-08-10 LAB — CBC AND DIFFERENTIAL
HCT: 44 (ref 36–46)
Hemoglobin: 14.2 (ref 12.0–16.0)
Neutrophils Absolute: 4
Platelets: 234 (ref 150–399)
WBC: 5.5

## 2019-08-10 LAB — BASIC METABOLIC PANEL
BUN: 16 (ref 4–21)
CO2: 29 — AB (ref 13–22)
Chloride: 105 (ref 99–108)
Creatinine: 0.8 (ref 0.5–1.1)
Glucose: 98
Potassium: 5.3 (ref 3.4–5.3)
Sodium: 146 (ref 137–147)

## 2019-08-10 LAB — LIPID PANEL
Cholesterol: 202 — AB (ref 0–200)
HDL: 60 (ref 35–70)
LDL Cholesterol: 102
LDl/HDL Ratio: 1.7
Triglycerides: 199 — AB (ref 40–160)

## 2019-08-10 LAB — COMPREHENSIVE METABOLIC PANEL
Albumin: 4.7 (ref 3.5–5.0)
Calcium: 9.8 (ref 8.7–10.7)
GFR calc Af Amer: 96
GFR calc non Af Amer: 82
Globulin: 2

## 2019-08-10 LAB — TSH: TSH: 2.66 (ref 0.41–5.90)

## 2019-08-10 LAB — CBC WITH AUTO DIFFERENTIAL
Absolute Baso #: 0 10*3/uL (ref 0.0–0.2)
Absolute Eos #: 0.2 10*3/uL (ref 0.0–0.5)
Absolute Lymph #: 1.1 10*3/uL (ref 1.0–3.2)
Absolute Mono #: 0.4 10*3/uL (ref 0.3–1.0)
Basophils %: 0.7 % (ref 0.0–2.0)
Eosinophils %: 4 % (ref 0.0–7.0)
Hematocrit: 43.6 % (ref 34.0–47.0)
Hemoglobin: 14.2 g/dL (ref 11.5–15.7)
Immature Grans (Abs): 0.02 10*3/uL (ref 0.00–0.06)
Immature Granulocytes: 0.4 % (ref 0.1–0.6)
Lymphocytes: 20.8 % (ref 15.0–45.0)
MCH: 32.2 pg (ref 27.0–34.5)
MCHC: 32.6 g/dL (ref 32.0–36.0)
MCV: 98.9 fL (ref 81.0–99.0)
MPV: 10.3 fL (ref 7.2–13.2)
Monocytes: 6.9 % (ref 4.0–12.0)
NRBC Absolute: 0 10*3/uL (ref 0.000–0.012)
NRBC Automated: 0 % (ref 0.0–0.2)
Neutrophils %: 67.2 % (ref 42.0–74.0)
Neutrophils Absolute: 3.7 10*3/uL (ref 1.6–7.3)
Platelets: 234 10*3/uL (ref 140–440)
RBC: 4.41 x10e6/mcL (ref 3.60–5.20)
RDW: 13 % (ref 11.0–16.0)
WBC: 5.5 10*3/uL (ref 3.8–10.6)

## 2019-08-11 LAB — COMPREHENSIVE METABOLIC PANEL
ALT: 28 U/L (ref 0–33)
AST: 29 U/L (ref 0–32)
Albumin/Globulin Ratio: 2.2 mmol/L — ABNORMAL HIGH (ref 1.00–2.00)
Albumin: 4.7 g/dL (ref 3.5–5.2)
Alk Phosphatase: 104 U/L (ref 35–117)
Anion Gap: 12 mmol/L (ref 2–17)
BUN: 16 mg/dL (ref 6–20)
CO2: 29 mmol/L (ref 22–29)
Calcium: 9.8 mg/dL (ref 8.6–10.0)
Chloride: 105 mmol/L (ref 98–107)
Creatinine: 0.8 mg/dL (ref 0.5–0.9)
GFR African American: 96 mL/min/{1.73_m2} (ref >=90)
GFR Non-African American: 82 mL/min/{1.73_m2} — ABNORMAL LOW (ref >=90)
Globulin: 2 g/dL (ref 1.9–4.4)
Glucose: 98 mg/dL (ref 70–99)
OSMOLALITY CALCULATED: 292 mOsm/kg — ABNORMAL HIGH (ref 270–287)
Potassium: 5.3 mmol/L (ref 3.5–5.3)
Sodium: 146 mmol/L — ABNORMAL HIGH (ref 135–145)
Total Bilirubin: 0.6 mg/dL (ref 0.00–1.20)
Total Protein: 6.8 g/dL (ref 6.4–8.3)

## 2019-08-11 LAB — LIPID PANEL
Chol/HDL Ratio: 3.4 (ref 0.0–4.4)
Cholesterol: 202 mg/dL — ABNORMAL HIGH (ref 100–200)
HDL: 60 mg/dL — ABNORMAL LOW (ref 65–96)
LDL Cholesterol: 102 mg/dL — ABNORMAL HIGH (ref 0.0–100.0)
LDL/HDL Ratio: 1.7
Triglycerides: 199 mg/dL — ABNORMAL HIGH (ref 0–149)
VLDL: 39.8 mg/dL (ref 5.0–40.0)

## 2019-08-31 HISTORY — PX: DISTAL BICEPS TENDON REPAIR: SHX1461

## 2019-10-17 NOTE — Nursing Note (Signed)
 Adult Admission Assessment - Text       Perioperative Admission Assessment Entered On:  10/17/2019 10:58 EST    Performed On:  10/17/2019 10:57 EST by Berl, RN, Sharlet LABOR               General   Call Start :   10/18/2019 12:43 EST   Call Complete :   10/18/2019 13:04 EST   Height/Length Estimated :   162.56 cm(Converted to: 5 ft 4 in, 5.33 ft, 64.00 in)    BMI   Estimated :   92.08 kg(Converted to: 203 lb 0 oz, 203.002 lb)    Body Mass Index Estimated :   34.84 kg/m2   Primary Care Physician/Specialists :   DR LABOR ALVINE - PCP  DR B YOUNG   Emergency Contact Name :   Chester - HUSBAND   Emergency Contact Phone :   [phone]   Languages :   Isadora Feeler, RNAsberry - 10/18/2019 12:42 EST   Information Given By :   Arch Feeler, RN, Rebecca - 10/18/2019 13:05 EST     Allergies   (As Of: 10/26/2019 07:19:24 EST)   Allergies (Active)   clindamycin  Estimated Onset Date:   Unspecified ; Reactions:   Vomiting ; Created By:   Keane RN, Madeline; Reaction Status:   Active ; Category:   Drug ; Substance:   clindamycin ; Type:   Allergy ; Severity:   Moderate ; Updated By:   Keane RN, Madeline; Reviewed Date:   10/26/2019 7:13 EST      morphine  Estimated Onset Date:   Unspecified ; Reactions:   Hives, Mild ; Created By:   Keane, RN, Madeline; Reaction Status:   Active ; Category:   Drug ; Substance:   morphine ; Type:   Allergy ; Severity:   Mild ; Updated By:   Keane RN, Sharyne; Reviewed Date:   10/26/2019 7:13 EST      penicillin  Estimated Onset Date:   Unspecified ; Reactions:   Unknown ; Created By:   Keane RN, Madeline; Reaction Status:   Active ; Category:   Drug ; Substance:   penicillin ; Type:   Allergy ; Severity:   Mild ; Updated By:   Keane RN, Sharyne; Reviewed Date:   10/26/2019 7:13 EST        Medication History   Medication List   (As Of: 10/26/2019 07:19:24 EST)   Normal Order    Lactated Ringers Injection solution 1,000 mL  :   Lactated Ringers Injection  solution 1,000 mL ; Status:   Ordered ; Ordered As Mnemonic:   Lactated Ringers Injection 1,000 mL ; Simple Display Line:   40 mL/hr, IV ; Ordering Provider:   JOELYN HOSEY DEL; Catalog Code:   Lactated Ringers Injection ; Order Dt/Tm:   10/26/2019 93:76:74 EST ; Comment:   Perioperative use ONLY  For Non Dialysis Patient          sodium chloride 0.9% Inj Soln 10 mL syringe  :   sodium chloride 0.9% Inj Soln 10 mL syringe ; Status:   Ordered ; Ordered As Mnemonic:   sodium chloride 0.9% flush syringe range dose ; Simple Display Line:   30 mL, IV Push, q8hr ; Ordering Provider:   POWELL-MD,  TAYLOR C; Catalog Code:   sodium chloride flush ; Order Dt/Tm:   10/26/2019 93:75:73 EST  A Patient Specific Medication  :   A Patient Specific Medication ; Status:   Ordered ; Ordered As Mnemonic:   A Patient Specific Medication ; Simple Display Line:   1 EA, Kit-Combo, q15min, PRN: other (see comment) ; Ordering Provider:   CASIMIR WADDELL BROCKS; Catalog Code:   A Patient Specific Medication ; Order Dt/Tm:   10/26/2019 06:24:26 EST          A Patient Specific Refrigerated Medication  :   A Patient Specific Refrigerated Medication ; Status:   Ordered ; Ordered As Mnemonic:   A Patient Specific Refrigerated Medication ; Simple Display Line:   1 EA, Kit-Combo, q5min, PRN: other (see comment) ; Ordering Provider:   CASIMIR WADDELL BROCKS; Catalog Code:   A Patient Specific Refrigerated Medicati ; Order Dt/Tm:   10/26/2019 06:24:26 EST ; Comment:   to access the patient specific Refrigerated medications          Delivery and Return [place] Access  :   Delivery and Return [place] Access ; Status:   Ordered ; Ordered As Mnemonic:   Delivery and Return Bin Access ; Simple Display Line:   1 EA, Kit-Combo, q67min, PRN: other (see comment) ; Ordering Provider:   CASIMIR WADDELL BROCKS; Catalog Code:   Delivery and Return Bin Access ; Order Dt/Tm:   10/26/2019 93:75:73 EST ; Comment:   This code grants access to the [employer] for  the Delivery and Return Bin Access          lidocaine 1% PF Inj Soln 2 mL  :   lidocaine 1% PF Inj Soln 2 mL ; Status:   Ordered ; Ordered As Mnemonic:   lidocaine 1% preservative-free injectable solution ; Simple Display Line:   0.25 mL, ID, q5min, PRN: other (see comment) ; Ordering Provider:   CASIMIR WADDELL BROCKS; Catalog Code:   lidocaine ; Order Dt/Tm:   10/26/2019 06:24:26 EST ; Comment:   to access lidocaine 1%  2 mL vial for IV start and Life Port access          Respiratory MDI Treatment  :   Respiratory MDI Treatment ; Status:   Ordered ; Ordered As Mnemonic:   Respiratory MDI Treatment ; Simple Display Line:   1 EA, Kit-Combo, q5min, PRN: other (see comment) ; Ordering Provider:   CASIMIR WADDELL BROCKS; Catalog Code:   Respiratory MDI Treatment ; Order Dt/Tm:   10/26/2019 06:24:26 EST          sodium chloride 0.9% Inj Soln 10 mL syringe  :   sodium chloride 0.9% Inj Soln 10 mL syringe ; Status:   Ordered ; Ordered As Mnemonic:   sodium chloride 0.9% flush syringe range dose ; Simple Display Line:   30 mL, IV Push, q5min, PRN: other (see comment) ; Ordering Provider:   CASIMIR WADDELL BROCKS; Catalog Code:   sodium chloride flush ; Order Dt/Tm:   10/26/2019 06:24:26 EST          sodium chloride 0.9% Inj Soln 10 mL vial PF  :   sodium chloride 0.9% Inj Soln 10 mL vial PF ; Status:   Ordered ; Ordered As Mnemonic:   sodium chloride 0.9% vial for reconstitution range dose ; Simple Display Line:   30 mL, IV Push, q5min, PRN: other (see comment) ; Ordering Provider:   CASIMIR WADDELL BROCKS; Catalog Code:   sodium chloride flush ; Order Dt/Tm:   10/26/2019 06:24:26 EST ;  Comment:   for access to sodium chloride 0.9% vial when needed as a diluent for reconstitutable medications          sterile water Inj Soln 10 mL  :   sterile water Inj Soln 10 mL ; Status:   Ordered ; Ordered As Mnemonic:   sterile water for reconstitution ; Simple Display Line:   10 mL, N/A, q5min, PRN: other (see comment) ; Ordering Provider:    CASIMIR WADDELL BROCKS; Catalog Code:   sterile water for reconstitution ; Order Dt/Tm:   10/26/2019 06:24:26 EST ; Comment:   Access sterile water when needed as a diluent for reconstitutable medications. Not for IV use.          ceFAZolin duplex  :   ceFAZolin duplex ; Status:   Ordered ; Ordered As Mnemonic:   ceFAZolin ; Simple Display Line:   2 g, 50 mL, 100 mL/hr, IV Piggyback, On Call ; Ordering Provider:   JOELYN HOSEY DEL; Catalog Code:   ceFAZolin ; Order Dt/Tm:   10/26/2019 93:76:74 EST ; Comment:   Wt< 120 kg/264lb            Home Meds    ergocalciferol  :   ergocalciferol ; Status:   Documented ; Ordered As Mnemonic:   Vitamin D ; Simple Display Line:   Oral, Daily, 0 Refill(s) ; Catalog Code:   ergocalciferol ; Order Dt/Tm:   10/26/2019 92:83:77 EST          dicyclomine  :   dicyclomine ; Status:   Documented ; Ordered As Mnemonic:   dicyclomine 10 mg oral capsule ; Simple Display Line:   10 mg, 1 caps, Oral, QID, PRN: INTESTINAL CRAMPING, 0 Refill(s) ; Catalog Code:   dicyclomine ; Order Dt/Tm:   10/18/2019 12:49:06 EST ; Comment:    THIS MEDICATION IS ASSOCIATED   WITH   AN INCREASED RISK OF FALLS.          ondansetron  :   ondansetron ; Status:   Documented ; Ordered As Mnemonic:   ondansetron 4 mg oral tablet ; Simple Display Line:   4 mg, 1 tabs, Oral, Once, PRN: nausea/vomiting, 10 tabs, 0 Refill(s) ; Catalog Code:   ondansetron ; Order Dt/Tm:   10/18/2019 12:48:23 EST          albuterol  :   albuterol ; Status:   Documented ; Ordered As Mnemonic:   Ventolin HFA 90 mcg/inh inhalation aerosol ; Simple Display Line:   1 puffs, Inhale, q6hr, PRN: for wheezing, 18 g, 0 Refill(s) ; Catalog Code:   albuterol ; Order Dt/Tm:   10/18/2019 12:47:26 EST          budesonide-formoterol  :   budesonide-formoterol ; Status:   Documented ; Ordered As Mnemonic:   Symbicort 80 mcg-4.5 mcg/inh inhalation aerosol ; Simple Display Line:   2 puffs, Inhale, BID, PRN: allergy symptoms, 6.9 g, 0 Refill(s) ; Catalog Code:    budesonide-formoterol ; Order Dt/Tm:   10/18/2019 12:47:53 EST          esomeprazole  :   esomeprazole ; Status:   Documented ; Ordered As Mnemonic:   esomeprazole 40 mg oral delayed release capsule ; Simple Display Line:   40 mg, 1 caps, Oral, BID, 0 Refill(s) ; Catalog Code:   esomeprazole ; Order Dt/Tm:   05/23/2019 04:13:11 EDT          rosuvastatin  :   rosuvastatin ; Status:  Documented ; Ordered As Mnemonic:   rosuvastatin 20 mg oral tablet ; Simple Display Line:   20 mg, 1 tabs, Oral, qAM, 0 Refill(s) ; Catalog Code:   rosuvastatin ; Order Dt/Tm:   05/23/2019 04:13:11 EDT          venlafaxine  :   venlafaxine ; Status:   Documented ; Ordered As Mnemonic:   venlafaxine 150 mg oral tablet, extended release ; Simple Display Line:   150 mg, 1 tabs, Oral, qAM, 30 tabs, 0 Refill(s) ; Catalog Code:   venlafaxine ; Order Dt/Tm:   05/23/2019 04:13:11 EDT ; Comment:   DO NOT CRUSH            Problem History   (As Of: 10/26/2019 07:19:24 EST)   Problems(Active)    Asthma (SNOMED CT  :698514988 )  Name of Problem:   Asthma ; Recorder:   Spehr, RN, Asberry; Confirmation:   Confirmed ; Classification:   Patient Stated ; Code:   [id number] ; Contributor System:   PowerChart ; Last Updated:   10/18/2019 12:51 EST ; Life Cycle Date:   10/18/2019 ; Life Cycle Status:   Active ; Vocabulary:   SNOMED CT        Depression (SNOMED CT  :40787988 )  Name of Problem:   Depression ; Recorder:   Spehr, Charity fundraiser, Asberry; Confirmation:   Confirmed ; Classification:   Patient Stated ; Code:   40787988 ; Contributor System:   PowerChart ; Last Updated:   10/18/2019 12:54 EST ; Life Cycle Date:   10/18/2019 ; Life Cycle Status:   Active ; Vocabulary:   SNOMED CT        H/O gastroesophageal reflux (GERD) (SNOMED CT  :6959856981 )  Name of Problem:   H/O gastroesophageal reflux (GERD) ; Recorder:   Spehr, Charity fundraiser, Asberry; Confirmation:   Confirmed ; Classification:   Patient Stated ; Code:   [id number] ; Contributor System:   PowerChart ; Last Updated:    10/18/2019 12:52 EST ; Life Cycle Date:   10/18/2019 ; Life Cycle Status:   Active ; Vocabulary:   SNOMED CT        H/O seasonal allergies (SNOMED CT  :748153981 )  Name of Problem:   H/O seasonal allergies ; Recorder:   Spehr, Charity fundraiser, Asberry; Confirmation:   Confirmed ; Classification:   Patient Stated ; Code:   748153981 ; Contributor System:   PowerChart ; Last Updated:   10/18/2019 12:51 EST ; Life Cycle Date:   10/18/2019 ; Life Cycle Status:   Active ; Vocabulary:   SNOMED CT        High cholesterol (SNOMED CT  :76716984 )  Name of Problem:   High cholesterol ; Recorder:   Keane, RN, Madeline; Confirmation:   Confirmed ; Classification:   Patient Stated ; Code:   76716984 ; Contributor System:   PowerChart ; Last Updated:   05/23/2019 4:13 EDT ; Life Cycle Date:   05/23/2019 ; Life Cycle Status:   Active ; Vocabulary:   SNOMED CT        Kidney stones (SNOMED CT  :841703981 )  Name of Problem:   Kidney stones ; Recorder:   Keane, RN, Sharyne; Confirmation:   Confirmed ; Classification:   Patient Stated ; Code:   841703981 ; Contributor System:   PowerChart ; Last Updated:   05/23/2019 4:13 EDT ; Life Cycle Date:   05/23/2019 ; Life Cycle Status:   Active ; Vocabulary:   SNOMED CT  Motion sickness (IMO  :50144 )  Name of Problem:   Motion sickness ; Recorder:   SYSTEM,  SYSTEM; Confirmation:   Confirmed ; Classification:   Patient Stated ; Code:   50144 ; Last Updated:   10/18/2019 13:05 EST ; Life Cycle Date:   10/18/2019 ; Life Cycle Status:   Active ; Vocabulary:   IMO        Postoperative nausea and vomiting (IMO  :[phone] )  Name of Problem:   Postoperative nausea and vomiting ; Recorder:   SYSTEM,  SYSTEM; Confirmation:   Confirmed ; Classification:   Patient Stated ; Code:   190584 ; Last Updated:   10/18/2019 13:05 EST ; Life Cycle Date:   10/18/2019 ; Life Cycle Status:   Active ; Vocabulary:   IMO          Diagnoses(Active)    Rupture of left distal biceps tendon  Date:   10/26/2019 ; Confirmation:    Confirmed ; Clinical Dx:   Rupture of left distal biceps tendon ; Classification:   Medical ; Clinical Service:   Non-Specified ; Code:   ICD-10-CM ; Probability:   0 ; Diagnosis Code:   D53.787J        Procedure History        -    Procedure History   (As Of: 10/26/2019 07:19:24 EST)     Anesthesia Minutes:   0 ; Procedure Name:   Hysterectomy and bilateral salpingo-oophorectomy sample ; Procedure Minutes:   0 ; Last Reviewed Dt/Tm:   10/26/2019 07:17:21 EST            Anesthesia Minutes:   0 ; Procedure Name:   EGD (esophagogastroduodenoscopy) gastric outlet reduction ; Procedure Minutes:   0 ; Last Reviewed Dt/Tm:   10/26/2019 07:17:21 EST            Procedure Dt/Tm:   09/12/2013 ; Anesthesia Minutes:   0 ; Procedure Name:   Nissen fundoplication ; Procedure Minutes:   0 ; Last Reviewed Dt/Tm:   10/26/2019 07:17:21 EST            Anesthesia Minutes:   0 ; Procedure Name:   Colonoscopy ; Procedure Minutes:   0 ; Last Reviewed Dt/Tm:   10/26/2019 07:17:21 EST            History Confirmation   Problem History Changes PAT :   No   Procedure History Changes PAT :   No   Baker, RN, Jonette - 10/26/2019 7:13 EST   Anesthesia/Sedation   Anesthesia History :   Prior general anesthesia   SN - Malignant Hyperthermia :   Denies   Previous Problem with Anesthesia :   PostOp nausea and vomitting, Motion Sickness   Moderate Sedation History :   Prior sedation for procedure   Previous Problem With Sedation :   None   Symptoms of Sleep Apnea :   No OSA Symptoms   Symptoms of Sleep Apnea Score :   0    Shortness of Breath Indicator :   No shortness of breath   Pregnancy Status :   Patient denies   Spehr, RN, Asberry - 10/18/2019 12:42 EST   Bloodless Medicine   Is Blood Transfusion Acceptable to Patient :   Yes   Spehr, RN, Asberry - 10/18/2019 12:42 EST   ID Risk Screen Symptoms   Recent Travel History :   No recent travel   Close Contact with COVID-19 ID :   Preadmission  testing patients only   Last 14 days COVID-19 ID :   No   TB Symptom  Screen :   No symptoms   C. diff Symptom/History ID :   Neither of the above   Spehr, RN, Asberry - 10/18/2019 12:42 EST   ID COVID-19 Screen   Fever OR Chills :   No   Headache :   No   New or Worsening Cough :   No   Fatigue :   No   Shortness of Breath ID :   No   Myalgia (Muscle Pain) :   No   Dyspnea :   No   Diarrhea :   No   Sore Throat :   No   Nausea :   No   Laryngitis :   No   Sudden Loss of Taste or Smell :   No   Spehr, RN, Asberry - 10/18/2019 12:42 EST   Social History   Social History   (As Of: 10/26/2019 07:19:24 EST)   Tobacco:        Tobacco use: Never (less than 100 in lifetime).   (Last Updated: 10/18/2019 12:56:42 EST by Lillie, RN, Asberry)          Electronic Cigarette/Vaping:        Never Electronic Cigarette Use.   (Last Updated: 10/18/2019 12:56:46 EST by Lillie, RN, Asberry)          Alcohol:        Current, Beer, Wine, Liquor, 1-2 times per week   (Last Updated: 10/18/2019 12:57:00 EST by Lillie, RN, Asberry)          Substance Use:        Opioid Naive - not currently taking opioids, Denies   (Last Updated: 10/18/2019 12:57:07 EST by Lillie, RN, Asberry)            Advance Directive   Advance Directive :   No   Spehr, RN, Asberry - 10/18/2019 12:42 EST   PAT Patient Instructions   Patient Arrival Time PAT :   10/26/2019 0:00 EST   (Comment: PER MD OFFICE [place], RN, Asberry - 10/18/2019 12:42 EST] )   Medications in AM :   ROSUVASTATIN, ESOMEPRAZOLE, VENLAFAXINE, SYMBICORT, VENTOLIN PRN, DICYCLOMINE PRN, ONDANSETRON PRN   Medication Understanding :   Verbalizes understanding   NPO PAT :   NPO after midnight   Spehr, RN, Asberry - 10/18/2019 12:42 EST   PAT Instructions Grid   Make up Understanding :   Verbalizes understanding   Jewelry Understanding :   Verbalizes understanding   Perfume Understanding :   Verbalizes understanding   Valuables Understanding :   Verbalizes understanding   Smoking Understanding :   Verbalizes understanding   Clothing Understanding :   Verbalizes understanding   Contact MD  for Illness :   Financial trader MD for skin injury :   Verbalizes understanding   Spehr, RN, Asberry - 10/18/2019 12:42 EST   Service Line PAT :   Ortho   Laterality PAT :   Left   Prep PAT :   Hibiclens, Inhalers   Name of Contact PAT :   CHRIS Visscher   Relationship of Contact PAT :   SPOUSE   Contact Number PAT :   [phone]   Transportation Instructions PAT :   Accompany to Hospital, Transport Home, Remain with 24 hours post-procedure   Additional Contacts PAT :   COVID TEST 02/22  @ [facility]  Spehr, RN, Asberry - 10/18/2019 12:42 EST     Harm Screen   Feels Unsafe at Home :   No   Agency(s)/Others notified :   No   Last 3 mo, thoughts killing self/others :   Patient denies   Engineer, production, RN, Jonette - 10/26/2019 7:13 EST

## 2019-10-24 LAB — COVID-19: SARS-CoV-2: NOT DETECTED

## 2019-10-26 NOTE — Case Communication (Signed)
Discharge Follow-Up Form - Text       Discharge Follow-Up Entered On:  10/30/2019 15:12 EST    Performed On:  10/30/2019 15:11 EST by Dan Humphreys, RN, Carol               Clinical   Provider Follow-Up Post Discharge RTF :   Follow-Up Appointments    With:  Daryll Drown  Address:  , , , ;[phone]  Comment:  A follow-up appointment should already be scheduled. If not, please call the office to schedule your follow-up appointment for 7-10 days following your surgery.       Dan Humphreys RNOkey Regal - 10/30/2019 15:11 EST   Status   Case Status :   Completed   Dan Humphreys, RNOkey Regal - 10/30/2019 15:11 EST

## 2019-10-26 NOTE — Anesthesia Pre-Procedure Evaluation (Signed)
Preanesthesia Evaluation        Patient:   Rose Collins, Rose Collins            MRN: [id number]            FIN: [id number]               Age:   59 years     Sex:  Female     DOB:  Dec 01, 1960   Associated Diagnoses:   None   Author:   Lachrisha Ziebarth-MD,  Arvo Ealy K      Preoperative Information   NPO:  NPO greater than 8 hours.    Anesthesia history     Patient's history: negative.     Family's history: negative.        Health Status   Allergies:    Allergic Reactions (Selected)  Moderate  Clindamycin- Vomiting.  Mild  Morphine- Hives and mild.  Penicillin- Unknown.,    Allergies    (Active and Proposed Allergies Only)  morphine   (Severity: Mild, Onset: Unknown)   Reactions: Mild, Hives  clindamycin   (Severity: Moderate, Onset: Unknown)   Reactions: Vomiting  penicillin   (Severity: Mild, Onset: Unknown)   Reactions: Unknown     Current medications:    Home Medications (8) Active  dicyclomine 10 mg oral capsule 10 mg = 1 caps, PRN, Oral, QID  esomeprazole 40 mg oral delayed release capsule 40 mg = 1 caps, Oral, BID  ondansetron 4 mg oral tablet 4 mg = 1 tabs, PRN, Oral, Once  rosuvastatin 20 mg oral tablet 20 mg = 1 tabs, Oral, qAM  Symbicort 80 mcg-4.5 mcg/inh inhalation aerosol 2 puffs, PRN, Inhale, BID  venlafaxine 150 mg oral tablet, extended release 150 mg = 1 tabs, Oral, qAM  Ventolin HFA 90 mcg/inh inhalation aerosol 1 puffs, PRN, Inhale, q6hr  Vitamin D , Oral, Daily  ,    Medications (12) Active  Scheduled: (3)  ceFAZolin duplex  2 g 50 mL, IV Piggyback, On Call  scopolamine 1.5 mg TD Film, ER  1.5 mg 1 patches, Topical, Once  sodium chloride 0.9% Inj Soln 10 mL syringe  30 mL, IV Push, q8hr  Continuous: (1)  Lactated Ringers Injection solution 1,000 mL  1,000 mL, IV, 40 mL/hr  PRN: (8)  A Patient Specific Medication  1 EA, Kit-Combo, q66mn  A Patient Specific Refrigerated Medication  1 EA, Kit-Combo, q521m  Delivery and Return Bin Access  1 EA, Kit-Combo, q5m88m lidocaine 1% PF Inj Soln 2 mL  0.25 mL, ID, q5mi50mRespiratory MDI  Treatment  1 EA, Kit-Combo, q5min55modium chloride 0.9% Inj Soln 10 mL syringe  30 mL, IV Push, q5min 2mdium chloride 0.9% Inj Soln 10 mL vial PF  30 mL, IV Push, q5min  72mrile water Inj Soln 10 mL  10 mL, N/A, q5min   72mroblem list:    Active Problems (8)  Asthma   Depression   H/O gastroesophageal reflux (GERD)   H/O seasonal allergies   High cholesterol   Kidney stones   Motion sickness   Postoperative nausea and vomiting   ,    Problems   (Active Problems Only)    Kidney stone   (SNOMED CT: 15829601245809983 --)  Hypercholesterolemia   (SNOMED CT: 2328301538250539 --)  Asthma   (SNOMED CT: 30148501767341937 --)  H/O: non-drug allergy   (SNOMED CT: 25184601902409735 --)  History of gastroesophageal reflux disease   (SNOMED CT: 304014303299242683  Onset: --)  Depressive disorder   (SNOMED CT: 03474259, Onset: --)  Postoperative nausea and vomiting   (Other: 563875, Onset: --)  Motion sickness   (Other: 49855, Onset: --)        Histories   Past Medical History:    No active or resolved past medical history items have been selected or recorded.   Procedure history:    Nissen fundoplication (643329518) on 09/12/2013 at 62 Years.  Hysterectomy and bilateral salpingo-oophorectomy sample (841660630).  Colonoscopy (160109323).  EGD (esophagogastroduodenoscopy) gastric outlet reduction (5573220254).   Social History        Social & Psychosocial Habits    Alcohol  10/18/2019  Use: Current    Type: Beer, Liquor, Wine    Frequency: 1-2 times per week    Substance Use  10/18/2019  Opioid Assessment Opioid Naive-not taking    Use: Denies    Tobacco  10/18/2019  Use: Never (less than 100 in l    Electronic Cigarette/Vaping  10/18/2019  Electronic Cigarette Use: Never  .     Symptoms of Sleep Apnea Score: PAT Documentation: 0                        10/17/2019 10:57 EST        Physical Examination   Vital Signs   2/70/6237 6:28 EST Systolic Blood Pressure 315 mmHg  HI    Diastolic Blood Pressure 176 mmHg  >HHI    Temperature Oral 37.1  degC    Heart Rate Monitored 101 bpm  HI    Respiratory Rate 19 br/min    SpO2 99 %         Vital Signs (last 24 hrs)_____  Last Charted___________  Temp Oral     37.1 degC  (FEB 26 07:19)  Resp Rate         19 br/min  (FEB 26 07:19)  SBP      H 151mHg  (FEB 26 07:19)  DBP      C 1013mg  (FEB 26 07:19)  SpO2      99 %  (FEB 26 07:19)  Weight      91.8 kg  (FEB 26 07:19)  Height      157.5 cm  (FEB 26 07:19)  BMI      37.01  (FEB 26 07:23)     Measurements from flowsheet : Measurements   10/26/2019 7:23 EST Body Mass Index est meas 37.01 kg/m2    Body Mass Index Measured 37.01 kg/m2   10/26/2019 7:19 EST Height/Length Measured 157.5 cm    Weight Measured 91.8 kg    Weight Dosing 91.8 kg      Pain assessment:  Pain Assessment   10/26/2019 7:19 EST Numeric Rating Pain Scale 6    Numeric Pain Rating Comfort Function Goal 3    Primary Pain Location Arm    Primary Pain Laterality Left    Primary Pain Quality Aching      .    General:          Stress: No acute distress.         Appearance: Within normal limits.    Airway:          Mallampati classification: II (soft palate, fauces, uvula visible).         Thyromental Distance: Normal.         Mouth: Teeth ( Within normal limits, Crown ).  Throat: Within normal limits.    Head:  Normocephalic, Atraumatic.    Neck:  Full range of motion.    Respiratory:  Lungs are clear to auscultation, Breath sounds are equal.    Cardiovascular:  Normal rate, No murmur, Regular rhythm.    Gastrointestinal:  Deferred.    Neurologic:  Alert.       Review / Management   Results review:     No qualifying data available, Lab results: 10/26/2019 7:23 EST       Estimated Creatinine Clearance            82.79 mL/min  .       Assessment and Plan   American Society of Anesthesiologists#(ASA) physical status classification:  Class II.    Anesthetic Preoperative Plan     Anesthetic technique: General anesthesia, Regional anesthesia.     Maintenance airway: Laryngeal mask airway.     Opioid  Assessment: Opioid Na????ve.     Risks discussed: nausea, vomiting, sore throat, hypotension, serious complications.     Signature Line     Electronically Signed on 10/26/2019 07:39 AM EST   ________________________________________________   Inda Coke

## 2019-10-26 NOTE — Op Note (Signed)
Phase I Record - [facility]             Phase I Record - [place] Summary                                                                   Primary Physician:        Madelaine Bhat H    Case Number:              [id number]    Finalized Date/Time:      10/29/19 08:52:38    Pt. Name:                 Rose Collins, Rose Collins    D.O.B./Sex:               02/13/1961    Female    Med Rec #:                [id number]    Physician:                Katherine Basset    Financial #:              [id number]    Pt. Type:                 S    Room/Bed:                 /    Admit/Disch:              10/26/19 06:07:00 -                              10/26/19 11:55:00    Institution:       [facility] - Case Attendance - Phase I                                                                                          Entry 1                         Entry 2                                                                          Case Attendee             Durward Mallard, RN, French Ana    Role Performed            Surgeon Primary  Post Anesthesia Care                                                              Nurse    Time In     Time Out     Last Modified By:         Nelva Bush, RN, Tomie China, RN, Charlsie Merles                              10/26/19 10:09:26               10/26/19 10:09:26      [facility] - Case Times - Phase I                                                                                               Entry 1                                                                                                          Phase I In                10/26/19 10:25:00               Phase I Out                     10/26/19 11:10:00    Phase I Discharge         10/26/19 11:10:00    Time     Last Modified By:         Nelva Bush RN, Charlsie Merles                              10/26/19 11:21:42      [facility] - Case Times - Phase I Audit                                                                10/26/19 11:21:42         Owner:  [id number]  Modifier: [id number]                                                       <+> 1         Phase I Out        <+> 1         Phase I Discharge Time                Finalized By: Salome Arnt G-RN      Document Signatures                                                                             Signed By:           Nelva Bush RN, Charlsie Merles 10/26/19 11:21          Salome Arnt G-RN 10/29/19 08:52      Unfinalized History                                                                                     Date/Time            Username    Reason for Unfinalizing         Freetext Reason for Unfinalizing                                          10/26/19 11:22       [id number]     Finish Documentation

## 2019-10-26 NOTE — Case Communication (Signed)
Discharge Follow-Up Form - Text       Discharge Follow-Up Entered On:  10/30/2019 15:04 EST    Performed On:  10/30/2019 15:04 EST by Excell Seltzer, RN, Yvette               Clinical   Provider Follow-Up Post Discharge RTF :   Follow-Up Appointments    With:  Daryll Drown  Address:  , , , ;[phone]  Comment:  A follow-up appointment should already be scheduled. If not, please call the office to schedule your follow-up appointment for 7-10 days following your surgery.       Excell Seltzer, RN, Yvette - 10/30/2019 15:04 EST   Status   Case Status :   Incomplete   Phone Call History Post DC (Readmit) :   First call   TCM Call Outcome :   No answer/unable to reach   Secondary Readmit F/U Phone Call Date,Time :   10/31/2019 8:00 EST   Excell Seltzer, RN, Yvette - 10/30/2019 15:04 EST

## 2019-10-26 NOTE — Op Note (Signed)
Phase II Record - [facility]             Phase II Record - [facility] Summary                                                                  Primary Physician:        Madelaine Bhat H    Case Number:              [id number]    Finalized Date/Time:      10/26/19 12:13:02    Pt. Name:                 Rose Collins, Rose Collins    D.O.B./Sex:               12-28-1960    Female    Med Rec #:                [id number]    Physician:                Katherine Basset    Financial #:              [id number]    Pt. Type:                 S    Room/Bed:                 /    Admit/Disch:              10/26/19 06:07:00 -                              10/26/19 11:55:00    Institution:       [facility] - Case Attendance - Phase II                                                                                         Entry 1                                                                                                          Case Attendee             Roanna Raider RN, French Ana            Role Performed                  Phase II Nurse  Last Modified By:         Nelva Bush, RN, Charlsie Merles                              10/26/19 11:21:56      [facility] - Case Times - Phase II                                                                                              Entry 1                                                                                                          Phase II In               10/26/19 11:21:00               Phase II Out                    10/26/19 11:36:00    Phase II Discharge        10/26/19 11:55:00    Time     Last Modified By:         Nelva Bush RN, Charlsie Merles                              10/26/19 12:03:53      [facility] - Case Times - Phase II Audit                                                               10/26/19 12:03:53         Owner: [id number]                              Modifier: [id number]                                                       <+> 1         Phase II Discharge Time     10/26/19 12:03:37         Owner: [id number]  Modifier: [id number]                                                           1     <+> Phase II Out            1     <*> Phase II In                            10/26/19 11:22:00                Finalized By: Nelva Bush RN, Charlsie Merles      Document Signatures                                                                             Signed By:           Nelva Bush RN, Charlsie Merles 10/26/19 12:03          Nelva Bush RN, Wynona Canes A 10/26/19 12:13      Unfinalized History                                                                                     Date/Time            Username    Reason for Unfinalizing         Freetext Reason for Unfinalizing                                          10/26/19 12:12       [id number]     Finish Documentation

## 2019-10-26 NOTE — Nursing Note (Signed)
Nursing Discharge Summary - Text       Nursing Discharge Summary Entered On:  10/26/2019 11:26 EST    Performed On:  10/26/2019 11:26 EST by Nelva Bush RN, Charlsie Merles               DC Information   Discharge To :   Home independently   Mode of Discharge :   Wheelchair   Transportation :   Private vehicle   [place], [place], Wynona Canes A - 10/26/2019 11:26 EST

## 2019-10-26 NOTE — Anesthesia Post-Procedure Evaluation (Signed)
Postanesthesia Evaluation        Patient:   Rose Collins, Rose Collins            MRN: [id number]            FIN: [id number]               Age:   59 years     Sex:  Female     DOB:  03/13/1961   Associated Diagnoses:   None   Author:   Pahoua Schreiner-MD,  Bennie Scaff K      Postoperative Information   Post Operative Info:          Post operative day: Post Anesthesia Care Unit.         Patient location: PACU.       Assessment   Postanesthesia assessment   Vitals: Vital Signs   2/59/5638 75:64 EST Systolic Blood Pressure 332 mmHg     Diastolic Blood Pressure 88 mmHg     Heart Rate Monitored 99 bpm     Respiratory Rate 17 br/min     SpO2 96 %    9/51/8841 66:06 EST Systolic Blood Pressure 301 mmHg     Diastolic Blood Pressure 92 mmHg  HI     Temperature Oral 37 degC     Heart Rate Monitored 89 bpm     Respiratory Rate 15 br/min     SpO2 95 %    01/29/931 35:57 EST Systolic Blood Pressure 322 mmHg     Diastolic Blood Pressure 96 mmHg  HI     Heart Rate Monitored 96 bpm     Respiratory Rate 14 br/min     SpO2 96 %    0/25/4270 62:37 EST Systolic Blood Pressure 628 mmHg  HI     Diastolic Blood Pressure 91 mmHg  HI     Heart Rate Monitored 104 bpm  HI     Respiratory Rate 15 br/min     SpO2 96 %    11/12/1759 60:73 EST Systolic Blood Pressure 710 mmHg     Diastolic Blood Pressure 626 mmHg  HI     Heart Rate Monitored 112 bpm  HI     Respiratory Rate 14 br/min     SpO2 95 % (Modified)   10/26/2019 10:40 EST Heart Rate Monitored 112 bpm  HI     Respiratory Rate 19 br/min     SpO2 97 %    9/48/5462 70:35 EST Systolic Blood Pressure 009 mmHg  HI     Diastolic Blood Pressure 381 mmHg  >HHI     Heart Rate Monitored 118 bpm  HI     Respiratory Rate 12 br/min     SpO2 97 %    04/27/9370 69:67 EST Systolic Blood Pressure 893 mmHg  HI     Diastolic Blood Pressure 810 mmHg  >HHI     Temperature Oral 36.5 degC     Heart Rate Monitored 112 bpm  HI     Respiratory Rate In Error br/min (In Error)    SpO2 96 %    1/75/1025 8:52 EST Systolic Blood Pressure 778 mmHg  HI      Diastolic Blood Pressure 242 mmHg  >HHI     Temperature Oral 37.1 degC     Heart Rate Monitored 101 bpm  HI     Respiratory Rate 19 br/min     SpO2 99 %       , Measurements from flowsheet : Measurements   10/26/2019 7:23 EST  Body Mass Index est meas 37.01 kg/m2    Body Mass Index Measured 37.01 kg/m2   10/26/2019 7:19 EST Height/Length Measured 157.5 cm    Weight Measured 91.8 kg    Weight Dosing 91.8 kg      .     Respiratory function: Respiratory rate, airway, and oxygen saturation are at adequate levels.     Cardiovascular function: Heart Rate stable, Blood Pressure stable, Postoperative hydration status Adequate.     Mental status: appropriate for level of anesthesia.     Temperature: within normal limits.     Pain Control: Adequate.     Nausea/Vomiting: Absent.     Signature Line     Electronically Signed on 10/26/2019 02:32 PM EST   ________________________________________________   Sandrea Matte

## 2019-10-26 NOTE — Assessment & Plan Note (Signed)
PreOp Record - [facility]             PreOp Record - [facility] Summary                                                                     Primary Physician:        Lucien Mons    Case Number:              [id number]    Finalized Date/Time:      10/26/19 07:30:12    Pt. Name:                 Rose Collins, Rose Collins    D.O.B./Sex:               Mar 18, 1961    Female    Med Rec #:                [id number]    Physician:                Lucien Mons    Financial #:              [id number]    Pt. Type:                 S    Room/Bed:                 /    Admit/Disch:              10/26/19 06:07:00 -    Institution:       [facility] - Case Attendance - Preop                                                                                            Entry 1                         Entry 2                         Entry 3                                          Case Attendee             Pennelope Bracken, RN, Waunita Schooner, RN, Bronson Ing  [facility]    Role Performed            Surgeon Primary                 Preoperative Nurse              Preoperative Nurse    Time In     Time Out     Last Modified By:         Zonia Kief, RN, Conchita Paris, RN, Waunita Schooner, RN, [place]                              [place]la[place] 10/26/19 06:27:21        [place]la[place] 10/26/19 06:27:21        10/26/19 07:26:31      [facility] - Case Attendance - Preop Audit                                                             10/26/19 07:26:31         Owner: [id number]                              Modifier: [id number]                                                       <+> 3         Case Attendee        <+> 3         Role Performed        [facility] - Case Times - PreOp                                                                                                 Entry 1                                                                                                          Patient In  Room Time      10/26/19 06:12:00  Nurse In Time                   10/26/19 06:12:00    Nurse Out Time            10/26/19 07:30:00               Patient Ready for               10/26/19 07:30:00                                                              Surgery/Procedure     Last Modified By:         Luana Shu, RN, Yvette                              10/26/19 07:30:07      [place] - Case Times - PreOp Audit                                                                  10/26/19 07:30:07         Owner: [id number]                              Modifier: [id number]                                                       <+> 1         Patient Ready for Surgery/Procedure        <+> 1         Nurse Out Time                Finalized By: Luana Shu RN, Yvette      Document Signatures                                                                             Signed By:           Luana Shu, RN, Gerald[place] 10/26/19 07:30

## 2019-10-26 NOTE — Procedures (Signed)
 IntraOp Record - [facility]             IntraOp Record - [facility] Summary                                                                   Primary Physician:        JOELYN HOSEY DEL    Case Number:              [id number]    Finalized Date/Time:      10/26/19 12:00:57    Pt. Name:                 Rose Collins, Rose Collins    D.O.B./Sex:               [birth year]    Female    Med Rec #:                [id number]    Physician:                JOELYN HOSEY DEL    Financial #:              [id number]    Pt. Type:                 S    Room/Bed:                 /    Admit/Disch:              10/26/19 06:07:00 -    Institution:       [facility] - Case Attendance                                                                                                    Entry 1                         Entry 2                         Entry 3                                          Case Attendee             YOUNG-MD,  BRETT H              BUSCH-PA-C,  LAWRENCE C         HUMSI-MD,  MICHAEL K    Role Performed            Surgeon Primary                 First Assistant  Anesthesiologist    Time In                   10/26/19 08:43:00               10/26/19 08:43:00               10/26/19 08:43:00    Time Out                  10/26/19 10:23:00               10/26/19 10:23:00               10/26/19 10:23:00    Procedure                 Upper Extremity Tendon          Upper Extremity Tendon          Upper Extremity Tendon                              Repair Distal Ope(Left)         Repair Distal Ope(Left)         Repair Distal Ope(Left)    Last Modified By:         Belia RN, Tinnie FORBES Belia, RN, Tinnie FORBES Belia, RN, Lauren E                              10/26/19 12:00:55               10/26/19 12:00:55               10/26/19 12:00:55                                Entry 4                         Entry 5                         Entry 6                                          Case Attendee             SMITH-CRNA,  JEFF                DAVIS,  CHICK JONETTA Belia, RN, Lauren E    Role Performed            CRNA                            Surgical Scrub                  Circulator    Time In                   10/26/19 08:43:00  10/26/19 08:43:00               10/26/19 08:43:00    Time Out                  10/26/19 10:23:00               10/26/19 10:23:00               10/26/19 10:23:00    Procedure                 Upper Extremity Tendon          Upper Extremity Tendon          Upper Extremity Tendon                              Repair Distal Ope(Left)         Repair Distal Ope(Left)         Repair Distal Ope(Left)    Last Modified By:         Belia RN, Tinnie FORBES Belia, RN, Tinnie FORBES Belia, RN, Lauren E                              10/26/19 12:00:55               10/26/19 12:00:55               10/26/19 12:00:55    General Comments:            nate leech- [employer]rex rep      [facility] - Case Attendance Audit                                                                     10/26/19 12:00:55         Owner: [id number]                              Modifier: [id number]                                                           1     <*> Time Out            1     <*> Procedure            1     <*> Procedure                              Upper Extremity Tendon Repair Distal Ope(Left)            1     <*> Procedure  Upper Extremity Tendon Repair Distal Ope(Left)            2     <*> Time In            2     <*> Time Out            2     <*> Procedure            2     <*> Procedure                              Upper Extremity Tendon Repair Distal Ope(Left)            2     <*> Procedure                              Upper Extremity Tendon Repair Distal Ope(Left)            3     <*> Time In            3     <*> Time Out            3     <*> Procedure            3     <*> Procedure                              Upper Extremity Tendon Repair Distal Ope(Left)            3     <*> Procedure                               Upper Extremity Tendon Repair Distal Ope(Left)            4     <*> Time In            4     <*> Time Out            4     <*> Procedure            4     <*> Procedure                              Upper Extremity Tendon Repair Distal Ope(Left)            4     <*> Procedure                              Upper Extremity Tendon Repair Distal Ope(Left)            5     <*> Time In            5     <*> Time Out            5     <*> Procedure            5     <*> Procedure                              Upper Extremity Tendon Repair  Distal Ope(Left)            5     <*> Procedure                              Upper Extremity Tendon Repair Distal Ope(Left)            6     <*> Time In            6     <*> Time Out            6     <*> Procedure            6     <*> Procedure                              Upper Extremity Tendon Repair Distal Ope(Left)            6     <*> Procedure                              Upper Extremity Tendon Repair Distal Ope(Left)     10/26/19 09:03:09         Owner: [id number]                              Modifier: [id number]                                                           1     <*> Time In            1     <*> Procedure                              Upper Extremity Tendon Repair Distal Ope(Left)            1     <*> Procedure                              Upper Extremity Tendon Repair Distal Ope(Left)            2     <*> Case Attendee                          BUSCH-PA-C,  LAWRENCE C            2     <*> Role Performed            2     <+> Procedure            3     <*> Case Attendee                          HUMSI-MD,  MICHAEL K            3     <*> Role Performed            3     <+>  Procedure            4     <*> Case Attendee                          SMITH-CRNA,  JEFF            4     <*> Role Performed            4     <+> Procedure            5     <*> Case Attendee                          DAVIS,  ROSALIND D            5     <*> Role Performed            5     <+>  Procedure            6     <*> Case Attendee                          DeSouza, RN, Lauren E            6     <*> Role Performed            6     <+> Procedure        [facility] - Case Times                                                                                                         Entry 1                                                                                                          Patient      In Room Time             10/26/19 08:43:00               Out Room Time                   10/26/19 10:23:00    Anesthesia     Procedure      Start Time               10/26/19 08:59:00               Stop Time  10/26/19 10:11:00    Last Modified By:         Belia PEAK, Lauren E                              10/26/19 10:23:10      [facility] - Case Times Audit                                                                          10/26/19 10:23:10         Owner: [id number]                              Modifier: [id number]                                                       <+> 1         Out Room Time     10/26/19 10:11:44         Owner: [id number]                              Modifier: [id number]                                                       <+> 1         Stop Time     10/26/19 09:00:53         Owner: [id number]                              Modifier: [id number]                                                           1     <*> Start Time                             10/26/19 09:00:00        [facility] - General Case Data  Entry 1                                                                                                          Case Information      ASA Class                2                               Case Level                      Level 3     OR                       BH 03                           Specialty                       Orthopedic (SN)     Wound Class              1-Clean    Preop Diagnosis            S46.212D                        Postop Diagnosis                RUPTURE DISTAL BICEPS                                                                                              TENDON    Last Modified By:         Belia OBIE Tinnie FORBES                              10/26/19 09:09:44      [facility] - General Case Data Audit                                                                   10/26/19 09:09:44         Owner: [id number]  Modifier: [id number]                                                       <+> 1         Postop Diagnosis        [facility] - Procedures                                                                                                         Entry 1                                                                                                          Procedure     Description      Procedure                Upper Extremity Tendon          Modifiers                       Left                              Repair Distal Open     Surgical Procedure       LEFT DISTAL BICEPS     Text                     REPAIR    Primary Procedure         Yes                             Primary Surgeon                 JOELYN HOSEY DEL    Start                     10/26/19 08:59:00               Stop                            10/26/19 10:11:00    Anesthesia Type           General                         Surgical  Service                Orthopedic (SN)    Wound Class               1-Clean    Last Modified By:         Belia OBIE Tinnie FORBES                              10/26/19 10:11:45      [facility] - Procedures Audit                                                                          10/26/19 10:11:45         Owner: [id number]                              Modifier: [id number]                                                       <+> 1         Stop        [facility] - Cautery                                                                                                            Entry 1                                                                                                           ESU Type                  GENERATOR                       Identification                  [id number]                              COVIDIEN/[facility]  Number     Coag Setting (watts)      30                              Cut Setting (watts)             30    Grounding Pad             Yes                             Grounding Pad Site              Thigh, left    Needed?     Grounding Pad             DeSouza, RN, Lauren E           Outcome Met (O.10)              Yes    Applied By     Last Modified By:         Belia RN, Lauren E                              10/26/19 09:03:58      [facility] - Counts Initial and Final                                                                                           Entry 1                                                                                                          Initial Counts      Initial Counts           DAVIS,  ROSALIND D,             Items included in               Sponges, Sharps     Performed By             Belia, RN, Lauren E           the Initial Count     Final Counts      Final Counts             DAVIS,  ROSALIND D,             Final Count Status              Correct     Performed By  DeSouza, RN, Lauren E     Items Included in        Sponges, Sharps     Final Count     Outcome Met (O.20)        Yes    Last Modified By:         Belia RN, Lauren E                              10/26/19 10:03:28      [facility] - Counts Initial and Final Audit                                                            10/26/19 10:03:28         Owner: [id number]                              Modifier: [id number]                                                       <+> 1         Final Counts Performed By        <+> 1         Final Count Status        <+> 1         Items Included in Final Count        <+> 1         Outcome Met (O.20)        [facility] - Counts Additional                                                                                                   Entry 1                                                                                                          Additional Count          Closing Count                   Additional Count                DAVIS,  ROSALIND D,    Type                                                      Participants                    DeSouza, RN, Lauren E    Count Status              Correct                         Items Counted                   Sponges, Sharps    Outcome Met (O.20)        Yes    Last Modified By:         Belia RN, Lauren E                              10/26/19 09:57:16      [facility] - Dressing/Packing                                                                                                   Entry 1                                                                                                          Site                      Arm                             Site Details                    Left    Dressing Item     Details      Dressing Item            Wound Closure Strip,            Miscellaneous                   Shoulder     (Im.290)                 Medicated Gauze, 4x4's          (Im.290)  Immobilizer/Sling     Cast/Splint (Im.290)     Cast Padding, Plaster                              Splint    Last Modified By:         Belia RN, Lauren E                              10/26/19 09:58:23      [facility] - Fire Risk Assessment                                                                                               Entry 1                                                                                                          Fire Risk                 Alcohol Based Prep              Fire Risk Score                 3    Assessment: If            Solution, Surgical Site    checked, checkmark        Above Xiphoid, Ignition    = 1 point                 Source In Use    Last Modified By:         Belia, RN, Lauren  E                              10/26/19 09:04:23      [facility] - Implants/Endoscopy Stents                                                                                          Entry 1  Implant/Explant           Implant                         Catalog #                      [id number]    Implant     Identification      Description              KIT REPAIR IMPLANT              Expiration Date                 03/29/24                              PROXIMAL BICEPS                              [id number]     Lot Number               [id number]                        Manufacturer                    [employer]    Usage Data      Implant Site             LEFT BICEP                      Quantity                        1    Last Modified By:         Belia RN, Tinnie BRAVO                              10/26/19 09:27:43      [facility] - Patient Care Devices                                                                                               Entry 1                                                                                                          Equipment Type            MACHINE SEQUENTIAL  SCD Sleeve Site                 Legs Bilateral                              COMPRESSION    Equipment/Tag Number      [id number]                       Initiated Pre                   Yes                                                              Induction     Last Modified By:         Belia RN, Lauren E                              10/26/19 09:05:07      [facility] - Patient Positioning                                                                                                Entry 1                                                                                                          Procedure                 Upper Extremity Tendon          Body Position                   Supine                              Repair Distal  Ope(Left)    Left Arm Position         Extended on Hand Table          Right Arm Position              Resting at Side    Left Leg Position         Extended                        Right Leg Position  Extended    Feet Uncrossed            Yes                             Pressure Points                 Yes                                                              Checked     Positioning Device        Table Hand, Safety Strap        Positioned By                   JOELYN HOSEY DEL,                                                                                              SMITH-CRNA,  JEFF,                                                                                              DeSouza, RN, Lauren E    Outcome Met (O.80)        Yes    Last Modified By:         Belia RN, Lauren E                              10/26/19 09:06:06      [facility] - Time Out                                                                                                           Entry 1  Procedure                 Upper Extremity Tendon          Is everyone ready               Yes                              Repair Distal Ope(Left)         to perform time out     Have all members of       Yes                             Patient name and                Yes    the surgical team                                         DOB confirmed     been introduced     Allergies discussed       Yes                             Surgical procedure              Yes                                                              to be performed                                                               confirmed and                                                               verified by                                                               completed surgical                                                               consent      Correct surgical          Yes  Correct laterality              Yes    site marked and                                           confirmed     initials are     visible through     prepped and draped     field (or     alternative ID band     used)     Correct patient           Yes                             Surgeon shares                  Yes    position confirmed                                        operative plan,                                                               possible                                                               difficulties,                                                               expected duration,                                                               anticipated blood                                                               loss and reviews                                                               all  critical/specific                                                               concerns     Required blood            Yes                             Essential imaging               Yes    products, implants,                                       available and fetal     devices and/or                                            heartones confirmed     special equipment                                         (if applicable)     available and     sterility confirmed     VTE prophylaxis           Yes                             Antibiotics ordered             Yes    addressed                                                 and administered     Anesthesia shares         Yes                             Fire risk                       Yes    anesthetic plan and                                       assessment scored     reviews patient                                           and plan discussed     specific concerns     Appropriate drying        Yes  Surgeon states:                 Yes    time for prep                                             Does anyone have     observed before                                           any concerns? If     draping                                                   you see, suspect,                                                               or feel that                                                               patient care is                                                               being compromised,                                                               speak up for                                                               patient safety     Time Out Complete         10/26/19 08:58:00    Last Modified By:         Belia RN, Lauren E                              10/26/19 09:00:24      [facility] - Debrief  Entry 1                                                                                                          Procedure                 Upper Extremity Tendon          Final counts                    Yes                              Repair Distal Ope(Left)         correct and                                                               verbally verified                                                               with                                                               surgeon/licensed                                                               independent                                                               practitioner (if                                                               applicable)     Actual procedure          Yes  Postop diagnosis                Yes    performed confirmed                                       confirmed     Wound                     Yes                             Confirm specimens               Yes     classification                                            and specimens     confirmed                                                 labeled                                                               appropriately (if                                                               applicable)     Foley catheter            Yes                             Patient recovery                Yes    removed (if                                               plan confirmed     applicable)     Debrief Complete          10/26/19 09:59:00    Last Modified By:         Belia RN, Lauren E                              10/26/19 09:59:51      [facility] - Skin Assessment  Entry 1                                                                                                          Skin Integrity            Intact    Last Modified By:         Belia, RN, Lauren E                              10/26/19 09:06:09      [facility] - Skin Prep                                                                                                          Entry 1                                                                                                          Hair Removal     Skin Prep      Prep Agents (Im.270)     Chlorhexidine Gluconate         Prep Area (Im.270)              Arm                              2% w/Alcohol     Prep Area Details        Left                            Prep By                         Belia, RN, Lauren E    Outcome Met (O.100)       Yes    Last Modified By:         Belia RN, Tinnie BRAVO  10/26/19 09:06:26      [facility] - Tourniquet                                                                                                         Entry 1                                                                                                          Tourniquet Type           TOURNIQUET PNEUMATIC             Serial Number                   [id number]    Setting                   200 mmHg                        Placement                       Arm Upper Left    Padding (Im.120)          Yes    Tourniquet Times      Inflated                 10/26/19 08:59:00               Deflated                        10/26/19 09:10:00    Applied By                JOELYN,  BRETT H              Outcome Met (O.60)              Yes    Last Modified By:         Belia RN, Tinnie BRAVO                              10/26/19 09:14:49      [facility] - Tourniquet Audit  10/26/19 09:14:49         Owner: [id number]                              Modifier: [id number]                                                           1     <*> Tourniquet Type                        TOURNIQUET PNEUMATIC            1     <+> Deflated        [facility] - Transfer                                                                                                           Entry 1                                                                                                          Transferred By            SMITH-CRNA,  JEFF,              Via                             Patricio Hem, RN, Lauren E    Post-op Destination       PACU    Skin Assessment      Condition                Intact    Last Modified By:         Hem OBIE Tinnie FORBES                              10/26/19 09:07:44      Case Comments                                                                                         <  None>              Finalized By: Belia RN, Lauren E      Document Signatures                                                                             Signed By:           Belia OBIE Tinnie FORBES 10/26/19 10:23          DeSouza, RN, Tinnie FORBES 10/26/19 12:00      Unfinalized History                                                                                     Date/Time            Username     Reason for Unfinalizing         Freetext Reason for Unfinalizing                                          10/26/19 12:00       [id number]     Finish Documentation

## 2019-12-16 LAB — COMPREHENSIVE METABOLIC PANEL
ALT: 21 U/L (ref 0–33)
AST: 30 U/L (ref 0–32)
Albumin/Globulin Ratio: 2 mmol/L (ref 1.00–2.00)
Albumin: 4.5 g/dL (ref 3.5–5.2)
Alk Phosphatase: 128 U/L — ABNORMAL HIGH (ref 35–117)
Anion Gap: 10 mmol/L (ref 2–17)
BUN: 15 mg/dL (ref 6–20)
CO2: 28 mmol/L (ref 22–29)
Calcium: 9.5 mg/dL (ref 8.6–10.0)
Chloride: 104 mmol/L (ref 98–107)
Creatinine: 0.9 mg/dL (ref 0.5–1.0)
GFR African American: 83 mL/min/{1.73_m2} — ABNORMAL LOW (ref >=90)
GFR Non-African American: 71 mL/min/{1.73_m2} — ABNORMAL LOW (ref >=90)
Globulin: 2 g/dL (ref 1.9–4.4)
Glucose: 95 mg/dL (ref 70–99)
OSMOLALITY CALCULATED: 284 mOsm/kg (ref 270–287)
Potassium: 4.5 mmol/L (ref 3.5–5.3)
Sodium: 142 mmol/L (ref 135–145)
Total Bilirubin: 0.66 mg/dL (ref 0.00–1.20)
Total Protein: 6.9 g/dL (ref 6.4–8.3)

## 2019-12-16 LAB — CBC WITH AUTO DIFFERENTIAL
Absolute Baso #: 0 10*3/uL (ref 0.0–0.2)
Absolute Eos #: 0.2 10*3/uL (ref 0.0–0.5)
Absolute Lymph #: 1.3 10*3/uL (ref 1.0–3.2)
Absolute Mono #: 0.6 10*3/uL (ref 0.3–1.0)
Basophils %: 0.3 % (ref 0.0–2.0)
Eosinophils %: 2.3 % (ref 0.0–7.0)
Hematocrit: 42.5 % (ref 34.0–47.0)
Hemoglobin: 14.1 g/dL (ref 11.5–15.7)
Immature Grans (Abs): 0.02 10*3/uL (ref 0.00–0.06)
Immature Granulocytes: 0.2 % (ref 0.1–0.6)
Lymphocytes: 13.4 % — ABNORMAL LOW (ref 15.0–45.0)
MCH: 32.2 pg (ref 27.0–34.5)
MCHC: 33.2 g/dL (ref 32.0–36.0)
MCV: 97 fL (ref 81.0–99.0)
MPV: 9.8 fL (ref 7.2–13.2)
Monocytes: 6.4 % (ref 4.0–12.0)
Neutrophils %: 77.4 % — ABNORMAL HIGH (ref 42.0–74.0)
Neutrophils Absolute: 7.7 10*3/uL — ABNORMAL HIGH (ref 1.6–7.3)
Platelets: 256 10*3/uL (ref 140–440)
RBC: 4.38 x10e6/mcL (ref 3.60–5.20)
RDW: 13 % (ref 11.0–16.0)
WBC: 10 10*3/uL (ref 3.8–10.6)

## 2019-12-16 LAB — URINALYSIS W/ RFLX MICROSCOPIC
Bilirubin Urine: NEGATIVE
Blood, Urine: NEGATIVE
Glucose, UA: NEGATIVE mg/dL
Ketones, Urine: NEGATIVE mg/dL
Leukocyte Esterase, Urine: NEGATIVE
Nitrite, Urine: NEGATIVE
Protein, UA: NEGATIVE
Specific Gravity, UA: >=1.03 — AB (ref 1.003–1.035)
Urobilinogen, Urine: 0.2 EU/dL
pH, UA: 6 (ref 4.5–8.0)

## 2019-12-16 LAB — LIPASE: Lipase: 51 U/L (ref 13–60)

## 2019-12-16 NOTE — ED Notes (Signed)
 ED Patient Summary       ;          [facility]  [address] [place], [place] [address]  [phone]  Discharge Instructions (Patient)  _______________________________________     Rose Collins  DOB:  [birth year]                   MRN: [id number]                   FIN: [phone]  Reason For Visit: Abdominal pain; ABD PAIN  Final Diagnosis: Diverticulitis     Visit Date: 12/16/2019 14:15:00  Address: [address] [place] [address]  Phone: [phone]     Emergency Department Providers:         Primary Physician:   MAURO DONNICE BROCKS         [facility] would like to thank you for allowing us  to assist you with your healthcare needs. The following includes patient education materials and information regarding your injury/illness.     Follow-up Instructions:  You were seen today on an emergency basis. Please contact your primary care doctor for a follow up appointment. If you received a referral to a specialist doctor, it is important you follow-up as instructed.    It is important that you call your follow-up doctor to schedule and confirm the location of your next appointment. Your doctor may practice at multiple locations. The office location of your follow-up appointment may be different to the one written on your discharge instructions.    If you do not have a primary care doctor, please call [phone] for help in finding a [facility] Provider. For help in finding a specialist doctor, please call [phone].    The [facility] "Ask a Nurse" line in staffed by Registered Nurses and is a free service to the community. We are available Monday - Friday from 8am to 5pm to answer your questions about your health. Please call [phone].    If your condition gets worse before your follow-up with your primary care doctor or specialist, please return to the Emergency Department.        Coronavirus 2019 (COVID-19)  Reminders:     Patients aged 57 and older, people with increased risk for severe COVID-19 disease, or frontline workers with increased occupational risk can make an appointment for a COVID-19 vaccine. Patients can contact their [facility] doctors' offices to schedule an appointment to receive the COVID-19 vaccine at the [facility] or send us  an email at [email or web address]. Patients who do not have a [facility] physician can call [phone] to schedule vaccination appointments.            Scan this code with your phone camera to send an email to the address above.          Follow Up Appointments:  Primary Care Provider:      Name: LARWANCE LACE      Phone: [phone]                 With: Address: When:   [facility]OP[facility] [address], [address] [place] [address]  [phone] Business (1) Within 1 week              Printed Prescriptions:    Patient Education Materials:  Discharge Orders  Discharge Patient 12/16/19 16:07:00 EDT         Comment:      Diverticulitis, Easy-to-Read     Diverticulitis    Diverticulitis is when small pockets that have formed in your colon (large intestine) become infected or swollen.      HOME CARE     Follow your doctor's instructions.     Follow a special diet if told by your doctor.     When you feel better, your doctor may tell you to change your diet. You may be told to eat a lot of fiber. Fruits and vegetables are good sources of fiber. Fiber makes it easier to poop (have bowel movements).     Take supplements or probiotics as told by your doctor.     Only take medicines as told by your doctor.     Keep all follow-up visits with your doctor.    GET HELP IF:     Your pain does not get better.     You have a hard time eating food.     You are not pooping like normal.    GET HELP RIGHT AWAY IF:     Your pain gets worse.     Your problems do not get better.     Your problems suddenly get worse.     You have a fever.      You keep throwing up (vomiting).     You have bloody or black, tarry poop (stool).    MAKE SURE YOU:     Understand these instructions.     Will watch your condition.     Will get help right away if you are not doing well or get worse.    This information is not intended to replace advice given to you by your health care provider. Make sure you discuss any questions you have with your health care provider.    Document Released: 02/02/2008 Document Revised: 08/21/2013 Document Reviewed: 07/11/2013  Elsevier Interactive Patient Education ?2016 Elsevier Inc.         Allergy Info: morphine; penicillin; clindamycin     Medication Information:  [facility] ED Physicians provided you with a complete list of medications post discharge, if you have been instructed to stop taking a medication please ensure you also follow up with this information to your Primary Care Physician.  Unless otherwise noted, patient will continue to take medications as prescribed prior to the Emergency Room visit.  Any specific questions regarding your chronic medications and dosages should be discussed with your physician(s) and pharmacist.          acetaminophen-oxyCODONE (Percocet 5/325 oral tablet) 1-2 tabs Oral (given by mouth) every 6 hours as needed moderate pain (4-7). Refills: 0., MAX DAILY DOSE OF ACETAMINOPHEN = 4000 MG  albuterol (Ventolin HFA 90 mcg/inh inhalation aerosol) 1 Puffs Inhale (breathe in) every 6 hours as needed for wheezing.  budesonide-formoterol (Symbicort 80 mcg-4.5 mcg/inh inhalation aerosol) 2 Puffs Inhale (breathe in) 2 times a day as needed allergy symptoms.  ciprofloxacin (Cipro 500 mg oral tablet) 1 Tabs Oral (given by mouth) 2 times a day for 7 Days. Refills: 0.  dicyclomine (Bentyl 20 mg oral tablet) 1 Tabs Oral (given by mouth) 3 times a day as needed moderate pain (4-7) for 5 Days. Refills: 0.  dicyclomine (dicyclomine 10 mg oral capsule) 1 Capsules Oral (given by mouth) 4 times a  day as needed INTESTINAL CRAMPING.,  THIS MEDICATION IS ASSOCIATED  WITH  AN INCREASED RISK OF FALLS.  ergocalciferol (Vitamin Collins) Oral (given by mouth) every day.  esomeprazole (esomeprazole 40 mg oral delayed release capsule) 1 Capsules Oral (given by mouth) 2 times a day.  metroNIDAZOLE (Flagyl 500 mg oral tablet) 1 Tabs Oral (given by mouth) every 12 hours for 7 Days. Refills: 0.  ondansetron (ondansetron 4 mg oral tablet) 1 Tabs Oral (given by mouth) once as needed nausea/vomiting.  ondansetron (Zofran 4 mg oral tablet) 1 Tabs Oral (given by mouth) every 4 hours as needed nausea for 3 Days. Refills: 0.  rosuvastatin (rosuvastatin 20 mg oral tablet) 1 Tabs Oral (given by mouth) once a day (in the morning).  venlafaxine (venlafaxine 150 mg oral tablet, extended release) 1 Tabs Oral (given by mouth) once a day (in the morning)., DO NOT CRUSH      Medications Administered During Visit:              Medication Dose Route   Sodium Chloride 0.9% 1000 mL IV Piggyback   iopamidol 100 mL IV Contrast   ondansetron 4 mg IV Push   ketorolac 15 mg IV Push   dicyclomine 20 mg IM          Major Tests and Procedures:  The following procedures and tests were performed during your ED visit.  PROCEDURES%>  PROCEDURES COMMENTS%>          Laboratory Orders  Name Status Details   CBCDIFF Completed Blood, Stat, ST - Stat, 12/16/19 14:44:00 EDT, 12/16/19 14:44:00 EDT, Nurse collect, HOSKINS,  MATTHEW C-MD, Print label Y/N   CMP Completed Blood, Stat, ST - Stat, 12/16/19 14:44:00 EDT, 12/16/19 14:44:00 EDT, Nurse collect, HOSKINS,  MATTHEW C-MD, Print label Y/N   Lipase Lvl Completed Blood, Stat, ST - Stat, 12/16/19 14:44:00 EDT, 12/16/19 14:44:00 EDT, Nurse collect, HOSKINS,  MATTHEW C-MD, Print label Y/N   UA Rflx Mic Completed Urine, Clean Catch, Stat, ST - Stat, 12/16/19 14:44:00 EDT, Once, 12/16/19 14:44:00 EDT, Nurse collect, MAURO,  MATTHEW C-MD, Print label Y/N   Irwin Carbon Ordered Blood, Stat, ST - Stat, Collected, 12/16/19  14:55:00 EDT [id number], 12/16/19 14:55:00 EDT, Nurse collect, Venous Draw, 12/16/19 15:41:00 EDT, BH BB Login, HOSKINS,  MATTHEW C-MD, Print label Y/N, bh1_hemo_lbl2, 1.8 mL Aolz/*[id number]*/, Complete               Radiology Orders  Name Status Details   CT Abdomen Pelvis w/ Contrast Completed 12/16/19 14:51:00 EDT, STAT 1 hour or less, Reason: Abdominal pain, acute, nonlocalized, Transport ModeBETHA CALAMITY, pp_set_radiology_subspecialty, [id number], 9               Patient Care Orders  Name Status Details   Discharge Patient Ordered 12/16/19 16:07:00 EDT   ED Assessment Adult Completed 12/16/19 14:30:05 EDT, 12/16/19 14:30:05 EDT   ED Secondary Triage Completed 12/16/19 14:30:05 EDT, 12/16/19 14:30:05 EDT   ED Triage Adult Completed 12/16/19 14:15:48 EDT, 12/16/19 14:15:48 EDT       ---------------------------------------------------------------------------------------------------------------------  [facility]) encourages you to self-enroll in the [employer] Patient Portal.  [facility] Patient Portal will allow you to manage your personal health information securely from your own electronic device now and in the future.  To begin your Patient Portal enrollment process, please visit [email or web address] Click on "Sign up now" under [facility] [place].  If you find that you need additional assistance on the [facility] Patient Portal or need a copy of your medical records, please call the [facility] Medical Records Office at  [phone].  Comment:

## 2019-12-16 NOTE — ED Provider Notes (Signed)
Abdominal Pain *ED        Patient:   Rose Collins, Rose Collins            MRN: [id number]            FIN: [id number]               Age:   59 years     Sex:  Female     DOB:  10/01/1960   Associated Diagnoses:   Diverticulitis   Author:   Rebeca Allegra C-MD      Basic Information   Time seen: Provider Seen (ST)   ED Provider/Time:    Jenasis Straley,  Orine Goga C-MD / 12/16/2019 14:43  .   Additional information: Chief Complaint from Nursing Triage Note   Chief Complaint  Chief Complaint: Lower diffuse abd pain. +N/D for three days. Denies urinary symptoms. (12/16/19 14:26:00).      History of Present Illness   59 year old female presents with concerns of 3 days of lower quadrant abdominal pain as well as nausea vomiting and diarrhea.  Denies any fevers, urinary symptoms, other symptoms.  Pain is a sharp crampy sensation in the bilateral lower abdomen.  No treatments prior to arrival, no known alleviating or exacerbating factors otherwise.      Review of Systems             Additional review of systems information: All other systems reviewed and otherwise negative.      Health Status   Allergies:    Allergic Reactions (Selected)  Moderate  Clindamycin- Vomiting.  Mild  Morphine- Hives and mild.  Penicillin- Unknown..   Medications:  (Selected)   Inpatient Medications  Ordered  Bentyl: 20 mg, 2 mL, IM, Once  Isovue-300: 100 mL, IV Contrast, Once  Sodium Chloride 0.9% bolus: 1,000 mL, 2000 mL/hr, IV Piggyback, Once  Toradol: 15 mg, 1 mL, IV Push, Once  Zofran: 4 mg, 2 mL, IV Push, Once  Prescriptions  Prescribed  Percocet 5/325 oral tablet: 1-2 tabs, Oral, q6hr, PRN: moderate pain (4-7), 30 tabs, 0 Refill(s)  Documented Medications  Documented  Symbicort 80 mcg-4.5 mcg/inh inhalation aerosol: 2 puffs, Inhale, BID, PRN: allergy symptoms, 6.9 g, 0 Refill(s)  Ventolin HFA 90 mcg/inh inhalation aerosol: 1 puffs, Inhale, q6hr, PRN: for wheezing, 18 g, 0 Refill(s)  Vitamin D: Oral, Daily, 0 Refill(s)  dicyclomine 10 mg oral capsule: 10 mg, 1  caps, Oral, QID, PRN: INTESTINAL CRAMPING, 0 Refill(s)  esomeprazole 40 mg oral delayed release capsule: 40 mg, 1 caps, Oral, BID, 0 Refill(s)  ondansetron 4 mg oral tablet: 4 mg, 1 tabs, Oral, Once, PRN: nausea/vomiting, 10 tabs, 0 Refill(s)  rosuvastatin 20 mg oral tablet: 20 mg, 1 tabs, Oral, qAM, 0 Refill(s)  venlafaxine 150 mg oral tablet, extended release: 150 mg, 1 tabs, Oral, qAM, 30 tabs, 0 Refill(s).      Past Medical/ Family/ Social History   Medical history: Reviewed as documented in chart.   Surgical history:    Upper Extremity Tendon Repair Distal Open (Left) on 10/26/2019 at 36 Years.  Comments:  10/26/2019 10:23 EST - Leverne Humbles, RN, Lauren E  auto-populated from documented surgical case  Nissen fundoplication ([id number]) on 09/12/2013 at 43 Years.  Hysterectomy and bilateral salpingo-oophorectomy sample ([id number]).  Colonoscopy ([id number]).  EGD (esophagogastroduodenoscopy) gastric outlet reduction ([id number])., Reviewed as documented in chart.   Family history:    No family history items have been selected or recorded., Reviewed as documented in chart.   Social history:  Social & Psychosocial Habits    Alcohol  10/18/2019  Use: Current    Type: Beer, Liquor, Wine    Frequency: 1-2 times per week    Substance Use  10/18/2019  Opioid Assessment Opioid Naive-not taking    Use: Denies    Tobacco  10/18/2019  Use: Never (less than 100 in l    Electronic Cigarette/Vaping  10/18/2019  Electronic Cigarette Use: Never  , Reviewed as documented in chart.   Problem list:    Active Problems (8)  Asthma   Depression   H/O gastroesophageal reflux (GERD)   H/O seasonal allergies   High cholesterol   Kidney stones   Motion sickness   Postoperative nausea and vomiting   , per nurse's notes.      Physical Examination               Vital Signs   Vital Signs   12/16/2019 14:26 EDT Systolic Blood Pressure 126 mmHg    Diastolic Blood Pressure 103 mmHg  >HHI    Temperature Oral 37.2 degC    Heart Rate Monitored 105 bpm  HI     Respiratory Rate 18 br/min    SpO2 100 %   .   Measurements   12/16/2019 14:30 EDT Body Mass Index est meas 37.49 kg/m2    Body Mass Index Measured 37.49 kg/m2   12/16/2019 14:26 EDT Height/Length Measured 157 cm    Weight Dosing 92.4 kg   .   Basic Oxygen Information   12/16/2019 14:26 EDT Oxygen Therapy Room air    SpO2 100 %   .   General:  Alert, no acute distress.    Skin:  Warm, dry.    Head:  Normocephalic, atraumatic.    Neck:  Supple, trachea midline.    Eye:  Pupils are equal, round and reactive to light, extraocular movements are intact.    Cardiovascular:  Normal peripheral perfusion, No edema.    Respiratory:  Respirations are non-labored.   Gastrointestinal:  Non distended, Bilateral lower abdomen is tender to palpation, abdomen is soft and nondistended, no peritoneal signs..    Musculoskeletal:  Normal ROM, normal strength.    Neurological:  Alert and oriented to person, place, time, and situation, No focal neurological deficit observed, CN II-XII intact, normal sensory observed, normal motor observed, normal speech observed, normal coordination observed.    Psychiatric:  Cooperative, appropriate mood & affect.       Medical Decision Making   Differential Diagnosis:  Abdominal pain.   Documents reviewed:  Emergency department nurses' notes.   CT scan demonstrates uncomplicated diverticulitis, labs are unremarkable.  Symptoms well controlled in the emergency department.  She will be discharged on antibiotics with Zofran and Bentyl as well.  We will give her GI follow-up as an outpatient.      Reexamination/ Reevaluation   Vital signs   Basic Oxygen Information   12/16/2019 14:26 EDT Oxygen Therapy Room air    SpO2 100 %         Impression and Plan   Diagnosis   Diverticulitis (ICD10-CM K57.92, Discharge, Medical)   Plan   Condition: Improved, Stable.    Disposition: Medically cleared, Discharged: to home.    Prescriptions: Launch prescriptions   Pharmacy:  Bentyl 20 mg oral tablet (Prescribe): 20 mg, 1  tabs, Oral, TID, for 5 days, PRN: moderate pain (4-7), 15 tabs, 0 Refill(s)  Zofran 4 mg oral tablet (Prescribe): 4 mg, 1 tabs, Oral, q4hr, for 3 days,  PRN: nausea, 12 tabs, 0 Refill(s)  Flagyl 500 mg oral tablet (Prescribe): 500 mg, 1 tabs, Oral, q12hr, for 7 days, 14 tabs, 0 Refill(s)  Cipro 500 mg oral tablet (Prescribe): 500 mg, 1 tabs, Oral, BID, for 7 days, 14 tabs, 0 Refill(s).    Patient was given the following educational materials: Diverticulitis, Easy-to-Read, Diverticulitis, Easy-to-Read.    Follow up with: Julieanne Cotton Within 1 week.    Counseled: Patient, Regarding diagnosis, Regarding diagnostic results, Regarding treatment plan, Regarding prescription, Patient indicated understanding of instructions.    Signature Line     Electronically Signed on 12/16/2019 05:20 PM EDT   ________________________________________________   Volney Presser C-MD               Modified by: Volney Presser C-MD on 12/16/2019 04:07 PM EDT      Modified by: Volney Presser C-MD on 12/16/2019 05:20 PM EDT

## 2019-12-16 NOTE — Discharge Summary (Signed)
 ED Clinical Summary                         [facility]  [address]  [place], [place] [address]  [phone]           PERSON INFORMATION  Name: Rose Collins, Rose Collins Age:  59 Years DOB: 18-Jul-1961   Sex: Female Language: English PCP: LARWANCE,  AMY-DO   Marital Status:  Married Phone: [phone] Med Service: MED-Medicine   MRN:  [id number] Acct# [id number] Arrival: 12/16/2019 14:15:00   Visit Reason: Abdominal pain; ABD PAIN Acuity: 3 LOS: 000 02:06   Address:      [address] [place] [address]  Diagnosis:      Diverticulitis  Printed Prescriptions:            Allergies      penicillin (Unknown)      clindamycin (Vomiting)      morphine (Hives) (Mild)      Medications Administered During Visit:                  Medication Dose Route   Sodium Chloride 0.9% 1000 mL IV Piggyback   iopamidol 100 mL IV Contrast   ondansetron 4 mg IV Push   ketorolac 15 mg IV Push   dicyclomine 20 mg IM       Patient Medication List:              acetaminophen-oxyCODONE (Percocet 5/325 oral tablet) 1-2 tabs Oral (given by mouth) every 6 hours as needed moderate pain (4-7). Refills: 0., MAX DAILY DOSE OF ACETAMINOPHEN = 4000 MG  albuterol (Ventolin HFA 90 mcg/inh inhalation aerosol) 1 Puffs Inhale (breathe in) every 6 hours as needed for wheezing.  budesonide-formoterol (Symbicort 80 mcg-4.5 mcg/inh inhalation aerosol) 2 Puffs Inhale (breathe in) 2 times a day as needed allergy symptoms.  ciprofloxacin (Cipro 500 mg oral tablet) 1 Tabs Oral (given by mouth) 2 times a day for 7 Days. Refills: 0.  dicyclomine (Bentyl 20 mg oral tablet) 1 Tabs Oral (given by mouth) 3 times a day as needed moderate pain (4-7) for 5 Days. Refills: 0.  dicyclomine (dicyclomine 10 mg oral capsule) 1 Capsules Oral (given by mouth) 4 times a day as needed INTESTINAL CRAMPING.,  THIS MEDICATION IS ASSOCIATED  WITH  AN INCREASED RISK OF FALLS.  ergocalciferol (Vitamin D) Oral (given by mouth) every day.  esomeprazole  (esomeprazole 40 mg oral delayed release capsule) 1 Capsules Oral (given by mouth) 2 times a day.  metroNIDAZOLE (Flagyl 500 mg oral tablet) 1 Tabs Oral (given by mouth) every 12 hours for 7 Days. Refills: 0.  ondansetron (ondansetron 4 mg oral tablet) 1 Tabs Oral (given by mouth) once as needed nausea/vomiting.  ondansetron (Zofran 4 mg oral tablet) 1 Tabs Oral (given by mouth) every 4 hours as needed nausea for 3 Days. Refills: 0.  rosuvastatin (rosuvastatin 20 mg oral tablet) 1 Tabs Oral (given by mouth) once a day (in the morning).  venlafaxine (venlafaxine 150 mg oral tablet, extended release) 1 Tabs Oral (given by mouth) once a day (in the morning)., DO NOT CRUSH         Major Tests and Procedures:  The following procedures and tests were performed during your ED visit.  COMMONPROCEDURES%>  COMMON PROCEDURESCOMMENTS%>          Laboratory Orders  Name Status Details   CBCDIFF Completed Blood, Stat, ST -  Stat, 12/16/19 14:44:00 EDT, 12/16/19 14:44:00 EDT, Nurse collect, HOSKINS,  MATTHEW C-MD, Print label Y/N   CMP Completed Blood, Stat, ST - Stat, 12/16/19 14:44:00 EDT, 12/16/19 14:44:00 EDT, Nurse collect, HOSKINS,  MATTHEW C-MD, Print label Y/N   Lipase Lvl Completed Blood, Stat, ST - Stat, 12/16/19 14:44:00 EDT, 12/16/19 14:44:00 EDT, Nurse collect, HOSKINS,  MATTHEW C-MD, Print label Y/N   UA Rflx Mic Completed Urine, Clean Catch, Stat, ST - Stat, 12/16/19 14:44:00 EDT, Once, 12/16/19 14:44:00 EDT, Nurse collect, MAURO,  MATTHEW C-MD, Print label Y/N   Irwin Carbon Ordered Blood, Stat, ST - Stat, Collected, 12/16/19 14:55:00 EDT [id number], 12/16/19 14:55:00 EDT, Nurse collect, Venous Draw, 12/16/19 15:41:00 EDT, BH BB Login, HOSKINS,  MATTHEW C-MD, Print label Y/N, bh1_hemo_lbl2, 1.8 mL Aolz/*[id number]*/, Complete               Radiology Orders  Name Status Details   CT Abdomen Pelvis w/ Contrast Completed 12/16/19 14:51:00 EDT, STAT 1 hour or less, Reason: Abdominal pain, acute, nonlocalized, Transport Mode:  STRETCHER, pp_set_radiology_subspecialty, [id number], 9               Patient Care Orders  Name Status Details   Discharge Patient Ordered 12/16/19 16:07:00 EDT   ED Assessment Adult Completed 12/16/19 14:30:05 EDT, 12/16/19 14:30:05 EDT   ED Secondary Triage Completed 12/16/19 14:30:05 EDT, 12/16/19 14:30:05 EDT   ED Triage Adult Completed 12/16/19 14:15:48 EDT, 12/16/19 14:15:48 EDT             PROVIDER INFORMATION               Provider Role Assigned Sampson MAURO, MATTHEW C-MD ED Provider 12/16/2019 14:43:59    Arloa, RN, Sheri ED Nurse 12/16/2019 14:46:32        Attending Physician:  MAURO COUGH C-MD     Admit Doc  MAURO,  MATTHEW C-MD     Consulting Doc       VITALS INFORMATION  Vital Sign Triage Latest   Temp Oral ORAL_1%>37.2 degC ORAL%>37.2 degC   Temp Temporal TEMPORAL_1%> TEMPORAL%>   Temp Intravascular INTRAVASCULAR_1%> INTRAVASCULAR%>   Temp Axillary AXILLARY_1%> AXILLARY%>   Temp Rectal RECTAL_1%> RECTAL%>   02 Sat 100 % 96 %   Respiratory Rate RATE_1%>18 br/min RATE%>18 br/min   Peripheral Pulse Rate PULSE RATE_1%>93 bpm PULSE RATE%>102 bpm   Apical Heart Rate HEART RATE_1%> HEART RATE%>   Blood Pressure BLOOD PRESSURE_1%>/ BLOOD PRESSURE_1%>103 mmHg BLOOD PRESSURE%>153 mmHg / BLOOD PRESSURE%>90 mmHg                 Immunizations      No Immunizations Documented This Visit          DISCHARGE INFORMATION   Discharge Disposition: H Outpt-Sent Home   Discharge Location:    Home   Discharge Date and Time:    12/16/2019 16:21:40   ED Checkout Date and Time:    12/16/2019 16:21:40     DEPART REASON INCOMPLETE INFORMATION               Depart Action Incomplete Reason   Interactive View/I&O Recently assessed               Problems      No Problems Documented              Smoking Status      Never (less than 100 in lifetime)         PATIENT EDUCATION INFORMATION  Instructions:  Diverticulitis, Easy-to-Read     Follow up:                    With: Address: When:   [facility] [address],  [address] [place] [address]  [phone] Business (1) Within 1 week           ED PROVIDER DOCUMENTATION

## 2019-12-16 NOTE — ED Notes (Signed)
 ED Triage Note       ED Secondary Triage Entered On:  12/16/2019 15:08 EDT    Performed On:  12/16/2019 15:07 EDT by Arloa, RN, Sheri               General Information   Barriers to Learning :   None evident   ED Home Meds Section :   Document assessment   [facility] ED Fall Risk Section :   Document assessment   ED History Section :   Document assessment   ED Advance Directives Section :   Document assessment   ED Palliative Screen :   N/A (prefilled for <65yo)   Arloa RN, Sheri - 12/16/2019 15:07 EDT   (As Of: 12/16/2019 15:08:16 EDT)   Problems(Active)    Asthma (SNOMED CT  :698514988 )  Name of Problem:   Asthma ; Recorder:   Spehr, RN, Asberry; Confirmation:   Confirmed ; Classification:   Patient Stated ; Code:   [id number] ; Contributor System:   PowerChart ; Last Updated:   10/18/2019 12:51 EST ; Life Cycle Date:   10/18/2019 ; Life Cycle Status:   Active ; Vocabulary:   SNOMED CT        Depression (SNOMED CT  :40787988 )  Name of Problem:   Depression ; Recorder:   Spehr, Charity fundraiser, Asberry; Confirmation:   Confirmed ; Classification:   Patient Stated ; Code:   [id number] ; Contributor System:   PowerChart ; Last Updated:   10/18/2019 12:54 EST ; Life Cycle Date:   10/18/2019 ; Life Cycle Status:   Active ; Vocabulary:   SNOMED CT        H/O gastroesophageal reflux (GERD) (SNOMED CT  :6959856981 )  Name of Problem:   H/O gastroesophageal reflux (GERD) ; Recorder:   Spehr, Charity fundraiser, Asberry; Confirmation:   Confirmed ; Classification:   Patient Stated ; Code:   [id number] ; Contributor System:   PowerChart ; Last Updated:   10/18/2019 12:52 EST ; Life Cycle Date:   10/18/2019 ; Life Cycle Status:   Active ; Vocabulary:   SNOMED CT        H/O seasonal allergies (SNOMED CT  :748153981 )  Name of Problem:   H/O seasonal allergies ; Recorder:   Spehr, Charity fundraiser, Asberry; Confirmation:   Confirmed ; Classification:   Patient Stated ; Code:   [id number] ; Contributor System:   PowerChart ; Last Updated:   10/18/2019 12:51 EST ; Life Cycle Date:    10/18/2019 ; Life Cycle Status:   Active ; Vocabulary:   SNOMED CT        High cholesterol (SNOMED CT  :76716984 )  Name of Problem:   High cholesterol ; Recorder:   Keane, RN, Madeline; Confirmation:   Confirmed ; Classification:   Patient Stated ; Code:   76716984 ; Contributor System:   PowerChart ; Last Updated:   05/23/2019 4:13 EDT ; Life Cycle Date:   05/23/2019 ; Life Cycle Status:   Active ; Vocabulary:   SNOMED CT        Kidney stones (SNOMED CT  :841703981 )  Name of Problem:   Kidney stones ; Recorder:   Keane, RN, Sharyne; Confirmation:   Confirmed ; Classification:   Patient Stated ; Code:   841703981 ; Contributor System:   PowerChart ; Last Updated:   05/23/2019 4:13 EDT ; Life Cycle Date:   05/23/2019 ; Life Cycle Status:   Active ; Vocabulary:   SNOMED CT  Motion sickness (IMO  :50144 )  Name of Problem:   Motion sickness ; Recorder:   SYSTEM,  SYSTEM; Confirmation:   Confirmed ; Classification:   Patient Stated ; Code:   50144 ; Last Updated:   10/18/2019 13:05 EST ; Life Cycle Date:   10/18/2019 ; Life Cycle Status:   Active ; Vocabulary:   IMO        Postoperative nausea and vomiting (IMO  :[id number] )  Name of Problem:   Postoperative nausea and vomiting ; Recorder:   SYSTEM,  SYSTEM; Confirmation:   Confirmed ; Classification:   Patient Stated ; Code:   190584 ; Last Updated:   10/18/2019 13:05 EST ; Life Cycle Date:   10/18/2019 ; Life Cycle Status:   Active ; Vocabulary:   IMO          Diagnoses(Active)    Abdominal pain  Date:   12/16/2019 ; Diagnosis Type:   Reason For Visit ; Confirmation:   Complaint of ; Clinical Dx:   Abdominal pain ; Classification:   Medical ; Clinical Service:   Emergency medicine ; Code:   PNED ; Probability:   0 ; Diagnosis Code:   [id number]             -    Procedure History   (As Of: 12/16/2019 15:08:16 EDT)     Anesthesia Minutes:   0 ; Procedure Name:   Hysterectomy and bilateral salpingo-oophorectomy sample ; Procedure Minutes:    0            Anesthesia Minutes:   0 ; Procedure Name:   EGD (esophagogastroduodenoscopy) gastric outlet reduction ; Procedure Minutes:   0            Procedure Dt/Tm:   09/12/2013 ; Anesthesia Minutes:   0 ; Procedure Name:   Nissen fundoplication ; Procedure Minutes:   0            Anesthesia Minutes:   0 ; Procedure Name:   Colonoscopy ; Procedure Minutes:   0            Procedure Dt/Tm:   10/26/2019 08:59:00 EST ; Location:   [facility] OR ; Provider:   JOELYN BILLS H; Anesthesia Type:   General ; :   HUMSI-MD,  MICHAEL K; Anesthesia Minutes:   0 ; Procedure Name:   Upper Extremity Tendon Repair Distal Open (Left) ; Procedure Minutes:   54 ; Comments:     10/26/2019 10:23 EST - Belia, RN, Lauren E  auto-populated from documented surgical case ; Clinical Service:   Surgery            [facility] Fall Risk Assessment Tool   Hx of falling last 3 months ED Fall :   No   Patient confused or disoriented ED Fall :   No   Patient intoxicated or sedated ED Fall :   No   Patient impaired gait ED Fall :   No   Use a mobility assistance device ED Fall :   No   Patient altered elimination ED Fall :   No   [facility] ED Fall Score :   0    Arloa RNTylene - 12/16/2019 15:07 EDT   ED Advance Directive   Advance Directive :   No   Arloa RN, Sheri - 12/16/2019 15:07 EDT   Social History   Social History   (As Of: 12/16/2019 15:08:16 EDT)   Tobacco:  Tobacco use: Never (less than 100 in lifetime).   (Last Updated: 10/18/2019 12:56:42 EST by Lillie, RN, Asberry)          Electronic Cigarette/Vaping:        Never Electronic Cigarette Use.   (Last Updated: 10/18/2019 12:56:46 EST by Lillie, RN, Asberry)          Alcohol:        Current, Beer, Wine, Liquor, 1-2 times per week   (Last Updated: 10/18/2019 12:57:00 EST by Lillie, RN, Asberry)          Substance Use:        Opioid Naive - not currently taking opioids, Denies   (Last Updated: 10/18/2019 12:57:07 EST by Lillie, RN, Asberry)            Med Hx   Medication List   (As Of: 12/16/2019  15:08:16 EDT)   Normal Order    dicyclomine 10 mg/mL IM Soln 2 mL  :   dicyclomine 10 mg/mL IM Soln 2 mL ; Status:   Completed ; Ordered As Mnemonic:   Bentyl ; Simple Display Line:   20 mg, 2 mL, IM, Once ; Ordering Provider:   MAURO COUGH C-MD; Catalog Code:   dicyclomine ; Order Dt/Tm:   12/16/2019 14:51:49 EDT          iopamidol 61% Inj Soln 100 mL  :   iopamidol 61% Inj Soln 100 mL ; Status:   Ordered ; Ordered As Mnemonic:   Isovue-300 ; Simple Display Line:   100 mL, IV Contrast, Once ; Ordering Provider:   MAURO,  MATTHEW C-MD; Catalog Code:   iopamidol ; Order Dt/Tm:   12/16/2019 14:51:53 EDT          ketorolac 15 mg/mL Inj Soln 1 mL  :   ketorolac 15 mg/mL Inj Soln 1 mL ; Status:   Completed ; Ordered As Mnemonic:   Toradol ; Simple Display Line:   15 mg, 1 mL, IV Push, Once ; Ordering Provider:   MAURO,  MATTHEW C-MD; Catalog Code:   ketorolac ; Order Dt/Tm:   12/16/2019 14:51:49 EDT          ondansetron 2 mg/mL Inj Soln 2 mL  :   ondansetron 2 mg/mL Inj Soln 2 mL ; Status:   Completed ; Ordered As Mnemonic:   Zofran ; Simple Display Line:   4 mg, 2 mL, IV Push, Once ; Ordering Provider:   MAURO,  MATTHEW C-MD; Catalog Code:   ondansetron ; Order Dt/Tm:   12/16/2019 14:51:49 EDT          Sodium Chloride 0.9% intravenous solution Bolus  :   Sodium Chloride 0.9% intravenous solution Bolus ; Status:   Completed ; Ordered As Mnemonic:   Sodium Chloride 0.9% bolus ; Simple Display Line:   1,000 mL, 2000 mL/hr, IV Piggyback, Once ; Ordering Provider:   MAURO,  MATTHEW C-MD; Catalog Code:   Sodium Chloride 0.9% ; Order Dt/Tm:   12/16/2019 14:44:54 EDT            Prescription/Discharge Order    acetaminophen-oxyCODONE  :   acetaminophen-oxyCODONE ; Status:   Prescribed ; Ordered As Mnemonic:   Percocet 5/325 oral tablet ; Simple Display Line:   1-2 tabs, Oral, q6hr, PRN: moderate pain (4-7), 30 tabs, 0 Refill(s) ; Ordering Provider:   TOLBERT JERILYNN BROCKS; Catalog Code:   acetaminophen-oxyCODONE ;  Order Dt/Tm:   10/26/2019 10:18:56 EST ; Comment:   MAX DAILY  DOSE OF ACETAMINOPHEN = 4000 MG            Home Meds    ergocalciferol  :   ergocalciferol ; Status:   Documented ; Ordered As Mnemonic:   Vitamin D ; Simple Display Line:   Oral, Daily, 0 Refill(s) ; Catalog Code:   ergocalciferol ; Order Dt/Tm:   10/26/2019 92:83:77 EST          dicyclomine  :   dicyclomine ; Status:   Documented ; Ordered As Mnemonic:   dicyclomine 10 mg oral capsule ; Simple Display Line:   10 mg, 1 caps, Oral, QID, PRN: INTESTINAL CRAMPING, 0 Refill(s) ; Catalog Code:   dicyclomine ; Order Dt/Tm:   10/18/2019 12:49:06 EST ; Comment:    THIS MEDICATION IS ASSOCIATED   WITH   AN INCREASED RISK OF FALLS.          ondansetron  :   ondansetron ; Status:   Documented ; Ordered As Mnemonic:   ondansetron 4 mg oral tablet ; Simple Display Line:   4 mg, 1 tabs, Oral, Once, PRN: nausea/vomiting, 10 tabs, 0 Refill(s) ; Catalog Code:   ondansetron ; Order Dt/Tm:   10/18/2019 12:48:23 EST          albuterol  :   albuterol ; Status:   Documented ; Ordered As Mnemonic:   Ventolin HFA 90 mcg/inh inhalation aerosol ; Simple Display Line:   1 puffs, Inhale, q6hr, PRN: for wheezing, 18 g, 0 Refill(s) ; Catalog Code:   albuterol ; Order Dt/Tm:   10/18/2019 12:47:26 EST          budesonide-formoterol  :   budesonide-formoterol ; Status:   Documented ; Ordered As Mnemonic:   Symbicort 80 mcg-4.5 mcg/inh inhalation aerosol ; Simple Display Line:   2 puffs, Inhale, BID, PRN: allergy symptoms, 6.9 g, 0 Refill(s) ; Catalog Code:   budesonide-formoterol ; Order Dt/Tm:   10/18/2019 12:47:53 EST          esomeprazole  :   esomeprazole ; Status:   Documented ; Ordered As Mnemonic:   esomeprazole 40 mg oral delayed release capsule ; Simple Display Line:   40 mg, 1 caps, Oral, BID, 0 Refill(s) ; Catalog Code:   esomeprazole ; Order Dt/Tm:   05/23/2019 04:13:11 EDT          rosuvastatin  :   rosuvastatin ; Status:   Documented ; Ordered As Mnemonic:   rosuvastatin 20 mg  oral tablet ; Simple Display Line:   20 mg, 1 tabs, Oral, qAM, 0 Refill(s) ; Catalog Code:   rosuvastatin ; Order Dt/Tm:   05/23/2019 04:13:11 EDT          venlafaxine  :   venlafaxine ; Status:   Documented ; Ordered As Mnemonic:   venlafaxine 150 mg oral tablet, extended release ; Simple Display Line:   150 mg, 1 tabs, Oral, qAM, 30 tabs, 0 Refill(s) ; Catalog Code:   venlafaxine ; Order Dt/Tm:   05/23/2019 04:13:11 EDT ; Comment:   DO NOT CRUSH

## 2019-12-16 NOTE — ED Notes (Signed)
ED Triage Note       ED Triage Adult Entered On:  12/16/2019 14:30 EDT    Performed On:  12/16/2019 14:26 EDT by Wallace Keller, RN, Meagan               Triage   Chief Complaint :   Lower diffuse abd pain. +N/D for three days. Denies urinary symptoms.   Numeric Rating Pain Scale :   10 = Worst possible pain   [place] Mode of Arrival :   Private vehicle   Infectious Disease Documentation :   Document assessment   Temperature Oral :   37.2 degC(Converted to: 99.0 degF)    Heart Rate Monitored :   105 bpm (HI)    Respiratory Rate :   18 br/min   Systolic Blood Pressure :   126 mmHg   Diastolic Blood Pressure :   103 mmHg (>HHI)    SpO2 :   100 %   Oxygen Therapy :   Room air   Patient presentation :   None of the above   Chief Complaint or Presentation suggest infection :   No   Dosing Weight Obtained By :   Measured   Weight Dosing :   92.4 kg(Converted to: 203 lb 11 oz)    Height :   157 cm(Converted to: 5 ft 2 in)    Body Mass Index Dosing :   37 kg/m2   Anoceto, RN, Meagan - 12/16/2019 14:26 EDT   DCP GENERIC CODE   Tracking Acuity :   3   Tracking Group :   ED [employer]   [place], RN, Meagan - 12/16/2019 14:26 EDT   ED General Section :   Document assessment   Pregnancy Status :   Patient denies   ED Allergies Section :   Document assessment   ED Reason for Visit Section :   Document assessment   ED Quick Assessment :   Patient appears awake, alert, oriented to baseline. Skin warm and dry. Moves all extremities. Respiration even and unlabored. Appears in no apparent distress.   Anoceto, RN, Meagan - 12/16/2019 14:26 EDT   ID Risk Screen Symptoms   Recent Travel History :   No recent travel   Close Contact with COVID-19 ID :   No   Last 14 days COVID-19 ID :   No   TB Symptom Screen :   No symptoms   C. diff Symptom/History ID :   Neither of the above   Anoceto, RN, Meagan - 12/16/2019 14:26 EDT   Allergies   (As Of: 12/16/2019 14:30:04 EDT)   Allergies (Active)   clindamycin  Estimated Onset Date:   Unspecified  ; Reactions:   Vomiting ; Created By:   Jacques Navy RN, Madeline; Reaction Status:   Active ; Category:   Drug ; Substance:   clindamycin ; Type:   Allergy ; Severity:   Moderate ; Updated By:   Jacques Navy RN, Madeline; Reviewed Date:   12/16/2019 14:28 EDT      morphine  Estimated Onset Date:   Unspecified ; Reactions:   Hives, Mild ; Created By:   Jacques Navy, RN, Madeline; Reaction Status:   Active ; Category:   Drug ; Substance:   morphine ; Type:   Allergy ; Severity:   Mild ; Updated By:   Jacques Navy RN, Sheran Lawless; Reviewed Date:   12/16/2019 14:28 EDT      penicillin  Estimated Onset Date:   Unspecified ; Reactions:  Unknown ; Created By:   Jacques Navy RN, Madeline; Reaction Status:   Active ; Category:   Drug ; Substance:   penicillin ; Type:   Allergy ; Severity:   Mild ; Updated By:   Jacques Navy RN, Sheran Lawless; Reviewed Date:   12/16/2019 14:28 EDT        Psycho-Social   Last 3 mo, thoughts killing self/others :   Patient denies   Right click within box for Suspected Abuse policy link. :   None   Feels Unsafe at Home :   Yes   Anoceto, RN, Meagan - 12/16/2019 14:26 EDT   ED Reason for Visit   (As Of: 12/16/2019 14:30:04 EDT)   Problems(Active)    Asthma (SNOMED CT  :275170017 )  Name of Problem:   Asthma ; Recorder:   Spehr, RN, Lurena Joiner; Confirmation:   Confirmed ; Classification:   Patient Stated ; Code:   [id number] ; Contributor System:   Dietitian ; Last Updated:   10/18/2019 12:51 EST ; Life Cycle Date:   10/18/2019 ; Life Cycle Status:   Active ; Vocabulary:   SNOMED CT        Depression (SNOMED CT  :16384665 )  Name of Problem:   Depression ; Recorder:   Spehr, Charity fundraiser, Lurena Joiner; Confirmation:   Confirmed ; Classification:   Patient Stated ; Code:   99357017 ; Contributor System:   Dietitian ; Last Updated:   10/18/2019 12:54 EST ; Life Cycle Date:   10/18/2019 ; Life Cycle Status:   Active ; Vocabulary:   SNOMED CT        H/O gastroesophageal reflux (GERD) (SNOMED CT  :7939030092 )  Name of Problem:   H/O  gastroesophageal reflux (GERD) ; Recorder:   Spehr, Charity fundraiser, Lurena Joiner; Confirmation:   Confirmed ; Classification:   Patient Stated ; Code:   [id number] ; Contributor System:   Dietitian ; Last Updated:   10/18/2019 12:52 EST ; Life Cycle Date:   10/18/2019 ; Life Cycle Status:   Active ; Vocabulary:   SNOMED CT        H/O seasonal allergies (SNOMED CT  :335456256 )  Name of Problem:   H/O seasonal allergies ; Recorder:   Spehr, Charity fundraiser, Lurena Joiner; Confirmation:   Confirmed ; Classification:   Patient Stated ; Code:   389373428 ; Contributor System:   Dietitian ; Last Updated:   10/18/2019 12:51 EST ; Life Cycle Date:   10/18/2019 ; Life Cycle Status:   Active ; Vocabulary:   SNOMED CT        High cholesterol (SNOMED CT  :76811572 )  Name of Problem:   High cholesterol ; Recorder:   Jacques Navy, RN, Madeline; Confirmation:   Confirmed ; Classification:   Patient Stated ; Code:   62035597 ; Contributor System:   Dietitian ; Last Updated:   05/23/2019 4:13 EDT ; Life Cycle Date:   05/23/2019 ; Life Cycle Status:   Active ; Vocabulary:   SNOMED CT        Kidney stones (SNOMED CT  :416384536 )  Name of Problem:   Kidney stones ; Recorder:   Jacques Navy, RN, Sheran Lawless; Confirmation:   Confirmed ; Classification:   Patient Stated ; Code:   468032122 ; Contributor System:   Dietitian ; Last Updated:   05/23/2019 4:13 EDT ; Life Cycle Date:   05/23/2019 ; Life Cycle Status:   Active ; Vocabulary:   SNOMED CT        Motion sickness (IMO  :48250 )  Name of Problem:   Motion sickness ; Recorder:   SYSTEM,  SYSTEM; Confirmation:   Confirmed ; Classification:   Patient Stated ; Code:   51137 ; Last Updated:   10/18/2019 13:05 EST ; Life Cycle Date:   10/18/2019 ; Life Cycle Status:   Active ; Vocabulary:   IMO        Postoperative nausea and vomiting (IMO  :[phone] )  Name of Problem:   Postoperative nausea and vomiting ; Recorder:   SYSTEM,  SYSTEM; Confirmation:   Confirmed ; Classification:   Patient Stated ; Code:   649554 ; Last Updated:    10/18/2019 13:05 EST ; Life Cycle Date:   10/18/2019 ; Life Cycle Status:   Active ; Vocabulary:   IMO          Diagnoses(Active)    Abdominal pain  Date:   12/16/2019 ; Diagnosis Type:   Reason For Visit ; Confirmation:   Complaint of ; Clinical Dx:   Abdominal pain ; Classification:   Medical ; Clinical Service:   Emergency medicine ; Code:   PNED ; Probability:   0 ; Diagnosis Code:   [id number]

## 2019-12-16 NOTE — ED Notes (Signed)
ED Patient Education Note     Patient Education Materials Follows:  Easy-to-Read     Diverticulitis    Diverticulitis is when small pockets that have formed in your colon (large intestine) become infected or swollen.      HOME CARE     Follow your doctor's instructions.     Follow a special diet if told by your doctor.     When you feel better, your doctor may tell you to change your diet. You may be told to eat a lot of fiber. Fruits and vegetables are good sources of fiber. Fiber makes it easier to poop (have bowel movements).     Take supplements or probiotics as told by your doctor.     Only take medicines as told by your doctor.     Keep all follow-up visits with your doctor.    GET HELP IF:     Your pain does not get better.     You have a hard time eating food.     You are not pooping like normal.    GET HELP RIGHT AWAY IF:     Your pain gets worse.     Your problems do not get better.     Your problems suddenly get worse.     You have a fever.     You keep throwing up (vomiting).     You have bloody or black, tarry poop (stool).    MAKE SURE YOU:     Understand these instructions.     Will watch your condition.     Will get help right away if you are not doing well or get worse.    This information is not intended to replace advice given to you by your health care provider. Make sure you discuss any questions you have with your health care provider.    Document Released: 02/02/2008 Document Revised: 08/21/2013 Document Reviewed: 07/11/2013  Elsevier Interactive Patient Education ?2016 [employer]

## 2020-01-21 LAB — COVID-19 (LIAT): SARS-CoV-2: NOT DETECTED

## 2020-06-12 ENCOUNTER — Other Ambulatory Visit: Payer: Self-pay

## 2020-06-18 ENCOUNTER — Encounter: Payer: Self-pay | Admitting: Family Medicine

## 2020-06-18 ENCOUNTER — Ambulatory Visit (INDEPENDENT_AMBULATORY_CARE_PROVIDER_SITE_OTHER): Payer: Federal, State, Local not specified - PPO | Admitting: Family Medicine

## 2020-06-18 ENCOUNTER — Other Ambulatory Visit: Payer: Self-pay

## 2020-06-18 ENCOUNTER — Ambulatory Visit (INDEPENDENT_AMBULATORY_CARE_PROVIDER_SITE_OTHER): Payer: Federal, State, Local not specified - PPO

## 2020-06-18 VITALS — BP 132/86 | HR 92 | Temp 98.5°F | Ht 60.25 in | Wt 202.0 lb

## 2020-06-18 DIAGNOSIS — M545 Low back pain, unspecified: Secondary | ICD-10-CM | POA: Diagnosis not present

## 2020-06-18 DIAGNOSIS — I10 Essential (primary) hypertension: Secondary | ICD-10-CM | POA: Insufficient documentation

## 2020-06-18 DIAGNOSIS — Z23 Encounter for immunization: Secondary | ICD-10-CM | POA: Diagnosis not present

## 2020-06-18 DIAGNOSIS — K219 Gastro-esophageal reflux disease without esophagitis: Secondary | ICD-10-CM | POA: Diagnosis not present

## 2020-06-18 DIAGNOSIS — J4530 Mild persistent asthma, uncomplicated: Secondary | ICD-10-CM

## 2020-06-18 DIAGNOSIS — E875 Hyperkalemia: Secondary | ICD-10-CM | POA: Diagnosis not present

## 2020-06-18 DIAGNOSIS — F3342 Major depressive disorder, recurrent, in full remission: Secondary | ICD-10-CM | POA: Diagnosis not present

## 2020-06-18 DIAGNOSIS — Z7689 Persons encountering health services in other specified circumstances: Secondary | ICD-10-CM

## 2020-06-18 DIAGNOSIS — M4316 Spondylolisthesis, lumbar region: Secondary | ICD-10-CM

## 2020-06-18 DIAGNOSIS — E782 Mixed hyperlipidemia: Secondary | ICD-10-CM | POA: Diagnosis not present

## 2020-06-18 DIAGNOSIS — J45909 Unspecified asthma, uncomplicated: Secondary | ICD-10-CM | POA: Insufficient documentation

## 2020-06-18 DIAGNOSIS — E870 Hyperosmolality and hypernatremia: Secondary | ICD-10-CM

## 2020-06-18 DIAGNOSIS — F329 Major depressive disorder, single episode, unspecified: Secondary | ICD-10-CM | POA: Insufficient documentation

## 2020-06-18 DIAGNOSIS — F330 Major depressive disorder, recurrent, mild: Secondary | ICD-10-CM | POA: Insufficient documentation

## 2020-06-18 DIAGNOSIS — E785 Hyperlipidemia, unspecified: Secondary | ICD-10-CM | POA: Insufficient documentation

## 2020-06-18 MED ORDER — BUDESONIDE-FORMOTEROL FUMARATE 160-4.5 MCG/ACT IN AERO
2.0000 | INHALATION_SPRAY | Freq: Two times a day (BID) | RESPIRATORY_TRACT | 11 refills | Status: DC
Start: 2020-06-18 — End: 2021-06-04

## 2020-06-18 MED ORDER — VENLAFAXINE HCL ER 150 MG PO TB24
1.0000 | ORAL_TABLET | Freq: Every day | ORAL | 1 refills | Status: DC
Start: 2020-06-18 — End: 2020-11-26

## 2020-06-18 MED ORDER — ALBUTEROL SULFATE HFA 108 (90 BASE) MCG/ACT IN AERS
1.0000 | INHALATION_SPRAY | Freq: Four times a day (QID) | RESPIRATORY_TRACT | 11 refills | Status: DC | PRN
Start: 2020-06-18 — End: 2022-11-11

## 2020-06-18 MED ORDER — ESOMEPRAZOLE MAGNESIUM 40 MG PO CPDR
40.0000 mg | DELAYED_RELEASE_CAPSULE | Freq: Every day | ORAL | 1 refills | Status: DC
Start: 2020-06-18 — End: 2020-11-27

## 2020-06-18 MED ORDER — ROSUVASTATIN CALCIUM 20 MG PO TABS
20.0000 mg | ORAL_TABLET | Freq: Every day | ORAL | 1 refills | Status: DC
Start: 2020-06-18 — End: 2020-11-26

## 2020-06-18 MED ORDER — ONDANSETRON HCL 4 MG PO TABS
4.0000 mg | ORAL_TABLET | Freq: Three times a day (TID) | ORAL | 5 refills | Status: DC | PRN
Start: 2020-06-18 — End: 2020-11-26

## 2020-06-18 MED ORDER — DICYCLOMINE HCL 10 MG PO CAPS
10.0000 mg | ORAL_CAPSULE | Freq: Three times a day (TID) | ORAL | 5 refills | Status: DC
Start: 2020-06-18 — End: 2020-11-26

## 2020-06-18 NOTE — Patient Instructions (Addendum)
Pleasure to meet you today.  I have refilled your meds.  [facility] is located at [address] [place], [place] [address];> today or tomorrow before 4 pm for xray.   We will call you with results.   Please help [place] help you:  We are honored you have chosen [facility] for your Primary Care home. Below you will find basic instructions that you may need to access in the future. Please help [place] help you by reading the instructions, which cover many of the frequent questions we experience.   Prescription refills and request:  -In order to allow more efficient response time, please call your pharmacy for all refills. They will forward the request electronically to [place]. This allows for the quickest possible response. Request left on a nurse line can take longer to refill, since these are checked as time allows between office patients and other phone calls.  - refill request can take up to 3-5 working days to complete.  - If request is sent electronically and request is appropiate, it is usually completed in 1-2 business days.  - all patients will need to be seen routinely for all chronic medical conditions requiring prescription medications (see follow-up below). If you are overdue for follow up on your condition, you will be asked to make an appointment and we will call in enough medication to cover you until your appointment (up to 30 days).  - all controlled substances will require a face to face visit to request/refill.  - if you desire your prescriptions to go through a new pharmacy, and have an active script at original pharmacy, you will need to call your pharmacy and have scripts transferred to new pharmacy. This is completed between the pharmacy locations and not by your provider.    Results: Our office handles many outgoing and incoming calls daily. If we have not contacted you within 1 week about your results, please check your mychart to see if there is a message first and if not,  then contact our office.  In helping with this matter, you help decrease call volume, and therefore allow [place] to be able to respond to patients needs more efficiently.  We will always attempt to call you with results,  normal or abnormal. However, if we are unable to reach you we will send a message in your my chart with results.   Acute office visits (sick visit):  An acute visit is intended for a new problem and are scheduled in shorter time slots to allow schedule openings for patients with new problems. This is the appropriate visit to discuss a new problem. Problems will not be addressed by phone call or Echart message. Appointment is needed if requesting treatment. In order to provide you with excellent quality medical care with proper time for you to explain your problem, have an exam and receive treatment with instructions, these appointments should be limited to one new problem per visit. If you experience a new problem, in which you desire to be addressed, please make an acute office visit, we save openings on the schedule to accommodate you. Please do not save your new problem for any other type of visit, let us take care of it properly and quickly for you.   Follow up visits:  Depending on your condition(s) your provider will need to see you routinely in order to provide you with quality care and prescribe medication(s). Most chronic conditions (Example: hypertension, Diabetes, depression/anxiety... etc), require visits a couple times  a year. Your provider will instruct you on proper follow up for your personal medical conditions and history. Please make certain to make follow up appointments for your condition as instructed. Failing to do so could result in lapse in your medication treatment/refills. If you request a refill, and are overdue to be seen on a condition, we will always provide you with a 30 day script (once) to allow you time to schedule.    Medicare wellness (well visit): - we  have a wonderful Nurse Maudie Mercury), that will meet with you and provide you will yearly medicare wellness visits. These visits should occur yearly (can not be scheduled less than 1 calendar year apart) and cover preventive health, immunizations, advance directives and screenings you are entitled to yearly through your medicare benefits. Do not miss out on your entitled benefits, this is when medicare will pay for these benefits to be ordered for you.  These are strongly encouraged by your provider and is the appropriate type of visit to make certain you are up to date with all preventive health benefits. If you have not had your medicare wellness exam in the last 12 months, please make certain to schedule one by calling the office and schedule your medicare wellness with Maudie Mercury as soon as possible.   Yearly physical (well visit):  - Adults are recommended to be seen yearly for physicals. Check with your insurance and date of your last physical, most insurances require one calendar year between physicals. Physicals include all preventive health topics, screenings, medical exam and labs that are appropriate for gender/age and history. You may have fasting labs needed at this visit. This is a well visit (not a sick visit), new problems should not be covered during this visit (see acute visit).  - Pediatric patients are seen more frequently when they are younger. Your provider will advise you on well child visit timing that is appropriate for your their age. - This is not a medicare wellness visit. Medicare wellness exams do not have an exam portion to the visit. Some medicare companies allow for a physical, some do not allow a yearly physical. If your medicare allows a yearly physical you can schedule the medicare wellness with our nurse Maudie Mercury and have your physical with your provider after, on the same day. Please check with insurance for your full benefits.   Late Policy/No Shows:  - all new patients should arrive 15-30  minutes earlier than appointment to allow [place] time  to  obtain all personal demographics,  insurance information and for you to complete office paperwork. - All established patients should arrive 10-15 minutes earlier than appointment time to update all information and be checked in .  - In our best efforts to run on time, if you are late for your appointment you will be asked to either reschedule or if able, we will work you back into the schedule. There will be a wait time to work you back in the schedule,  depending on availability.  - If you are unable to make it to your appointment as scheduled, please call 24 hours ahead of time to allow [place] to fill the time slot with someone else who needs to be seen. If you do not cancel your appointment ahead of time, you may be charged a no show fee.
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
## 2020-06-19 ENCOUNTER — Encounter: Payer: Self-pay | Admitting: Family Medicine

## 2020-06-19 DIAGNOSIS — E870 Hyperosmolality and hypernatremia: Secondary | ICD-10-CM | POA: Insufficient documentation

## 2020-06-19 DIAGNOSIS — E875 Hyperkalemia: Secondary | ICD-10-CM | POA: Insufficient documentation

## 2020-06-19 DIAGNOSIS — M545 Low back pain, unspecified: Secondary | ICD-10-CM | POA: Insufficient documentation

## 2020-06-19 LAB — CBC
HCT: 42 % (ref 35.0–45.0)
Hemoglobin: 14.3 g/dL (ref 11.7–15.5)
MCH: 32.6 pg (ref 27.0–33.0)
MCHC: 34 g/dL (ref 32.0–36.0)
MCV: 95.7 fL (ref 80.0–100.0)
MPV: 10.1 fL (ref 7.5–12.5)
Platelets: 248 10*3/uL (ref 140–400)
RBC: 4.39 10*6/uL (ref 3.80–5.10)
RDW: 13.1 % (ref 11.0–15.0)
WBC: 6 10*3/uL (ref 3.8–10.8)

## 2020-06-19 LAB — BASIC METABOLIC PANEL
BUN: 14 mg/dL (ref 7–25)
CO2: 29 mmol/L (ref 20–32)
Calcium: 10.2 mg/dL (ref 8.6–10.4)
Chloride: 102 mmol/L (ref 98–110)
Creat: 0.95 mg/dL (ref 0.50–1.05)
Glucose, Bld: 91 mg/dL (ref 65–99)
Potassium: 4.5 mmol/L (ref 3.5–5.3)
Sodium: 142 mmol/L (ref 135–146)

## 2020-06-19 LAB — HEMOGLOBIN A1C
Hgb A1c MFr Bld: 4.9 % of total Hgb (ref <5.7)
Mean Plasma Glucose: 94 (calc)
eAG (mmol/L): 5.2 (calc)

## 2020-06-19 LAB — TSH: TSH: 2.06 mIU/L (ref 0.40–4.50)

## 2020-06-19 LAB — LIPID PANEL
Cholesterol: 168 mg/dL (ref <200)
HDL: 62 mg/dL (ref >=50)
LDL Cholesterol (Calc): 81 mg/dL (calc)
Non-HDL Cholesterol (Calc): 106 mg/dL (calc) (ref <130)
Total CHOL/HDL Ratio: 2.7 (calc) (ref <5.0)
Triglycerides: 151 mg/dL — ABNORMAL HIGH (ref <150)

## 2020-06-19 MED ORDER — DIAZEPAM 10 MG PO TABS
5.0000 mg | ORAL_TABLET | Freq: Every day | ORAL | 2 refills | Status: DC | PRN
Start: 2020-06-19 — End: 2021-06-10

## 2020-06-20 ENCOUNTER — Telehealth: Payer: Self-pay | Admitting: Family Medicine

## 2020-06-20 DIAGNOSIS — M47816 Spondylosis without myelopathy or radiculopathy, lumbar region: Secondary | ICD-10-CM | POA: Insufficient documentation

## 2020-06-20 DIAGNOSIS — M4316 Spondylolisthesis, lumbar region: Secondary | ICD-10-CM

## 2020-06-20 MED ORDER — MELOXICAM 15 MG PO TABS: 15.0000 mg | ORAL_TABLET | Freq: Every day | ORAL | 5 refills | Status: DC

## 2020-06-20 NOTE — Addendum Note (Signed)
Addended by: Deveron Furlong D on: 06/20/2020 10:47 AM   Modules accepted: Orders

## 2020-06-20 NOTE — Telephone Encounter (Signed)
Patient returned call. advised and voiced understanding.

## 2020-06-20 NOTE — Telephone Encounter (Signed)
LM for pt to returncall

## 2020-06-20 NOTE — Telephone Encounter (Signed)
Please inform patient the following information: Her lower back xray resulted with degenerative/arthitic changes of the lower spine and the last lumbar vertebra that sits on top of the sacrum has a mild forward slip.   I have called in a mediation called mobic/meloxicam to be taking once daily with food. This helps control arthritis  type pains when taken daily. She will need to make sure to take with food so it does not cause any GERD symptoms for her.    This is not a controlled substance and does not have addictive properties. If condition is not improving in 4 weeks on medication or worsening- follow up at that time for further evaluation.    Thanks.

## 2020-11-26 ENCOUNTER — Encounter: Payer: Self-pay | Admitting: Family Medicine

## 2020-11-26 ENCOUNTER — Ambulatory Visit (INDEPENDENT_AMBULATORY_CARE_PROVIDER_SITE_OTHER): Payer: Federal, State, Local not specified - PPO | Admitting: Family Medicine

## 2020-11-26 ENCOUNTER — Other Ambulatory Visit: Payer: Self-pay

## 2020-11-26 VITALS — BP 127/84 | HR 101 | Temp 98.5°F | Ht 61.5 in | Wt 205.0 lb

## 2020-11-26 DIAGNOSIS — J4530 Mild persistent asthma, uncomplicated: Secondary | ICD-10-CM

## 2020-11-26 DIAGNOSIS — M47816 Spondylosis without myelopathy or radiculopathy, lumbar region: Secondary | ICD-10-CM

## 2020-11-26 DIAGNOSIS — Z23 Encounter for immunization: Secondary | ICD-10-CM | POA: Diagnosis not present

## 2020-11-26 DIAGNOSIS — Z0000 Encounter for general adult medical examination without abnormal findings: Secondary | ICD-10-CM

## 2020-11-26 DIAGNOSIS — I10 Essential (primary) hypertension: Secondary | ICD-10-CM

## 2020-11-26 DIAGNOSIS — E782 Mixed hyperlipidemia: Secondary | ICD-10-CM

## 2020-11-26 DIAGNOSIS — Z1211 Encounter for screening for malignant neoplasm of colon: Secondary | ICD-10-CM

## 2020-11-26 DIAGNOSIS — Z114 Encounter for screening for human immunodeficiency virus [HIV]: Secondary | ICD-10-CM | POA: Diagnosis not present

## 2020-11-26 DIAGNOSIS — Z1159 Encounter for screening for other viral diseases: Secondary | ICD-10-CM

## 2020-11-26 DIAGNOSIS — E875 Hyperkalemia: Secondary | ICD-10-CM

## 2020-11-26 DIAGNOSIS — E870 Hyperosmolality and hypernatremia: Secondary | ICD-10-CM

## 2020-11-26 DIAGNOSIS — K219 Gastro-esophageal reflux disease without esophagitis: Secondary | ICD-10-CM | POA: Diagnosis not present

## 2020-11-26 DIAGNOSIS — Z1231 Encounter for screening mammogram for malignant neoplasm of breast: Secondary | ICD-10-CM | POA: Diagnosis not present

## 2020-11-26 DIAGNOSIS — M545 Low back pain, unspecified: Secondary | ICD-10-CM

## 2020-11-26 DIAGNOSIS — M4316 Spondylolisthesis, lumbar region: Secondary | ICD-10-CM

## 2020-11-26 DIAGNOSIS — F3342 Major depressive disorder, recurrent, in full remission: Secondary | ICD-10-CM

## 2020-11-26 LAB — LIPID PANEL
Cholesterol: 221 mg/dL — ABNORMAL HIGH (ref 0–200)
HDL: 61.7 mg/dL (ref >39.00)
LDL Cholesterol: 122 mg/dL — ABNORMAL HIGH (ref 0–99)
NonHDL: 159.16
Total CHOL/HDL Ratio: 4
Triglycerides: 187 mg/dL — ABNORMAL HIGH (ref 0.0–149.0)
VLDL: 37.4 mg/dL (ref 0.0–40.0)

## 2020-11-26 MED ORDER — ONDANSETRON HCL 4 MG PO TABS: 4.0000 mg | ORAL_TABLET | Freq: Three times a day (TID) | ORAL | 5 refills | Status: DC | PRN

## 2020-11-26 MED ORDER — DICYCLOMINE HCL 10 MG PO CAPS: 10.0000 mg | ORAL_CAPSULE | Freq: Three times a day (TID) | ORAL | 5 refills | Status: DC

## 2020-11-26 MED ORDER — VENLAFAXINE HCL ER 150 MG PO TB24: 1.0000 | ORAL_TABLET | Freq: Every day | ORAL | 1 refills | Status: DC

## 2020-11-26 MED ORDER — MELOXICAM 15 MG PO TABS: 15.0000 mg | ORAL_TABLET | Freq: Every day | ORAL | 5 refills | Status: DC

## 2020-11-26 MED ORDER — ROSUVASTATIN CALCIUM 20 MG PO TABS: 20.0000 mg | ORAL_TABLET | Freq: Every day | ORAL | 1 refills | Status: DC
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
## 2020-11-26 NOTE — Patient Instructions (Signed)
Health Maintenance, Female Adopting a healthy lifestyle and getting preventive care are important in promoting health and wellness. Ask your health care provider about:  The right schedule for you to have regular tests and exams.  Things you can do on your own to prevent diseases and keep yourself healthy. What should I know about diet, weight, and exercise? Eat a healthy diet  Eat a diet that includes plenty of vegetables, fruits, low-fat dairy products, and lean protein.  Do not eat a lot of foods that are high in solid fats, added sugars, or sodium.   Maintain a healthy weight Body mass index (BMI) is used to identify weight problems. It estimates body fat based on height and weight. Your health care provider can help determine your BMI and help you achieve or maintain a healthy weight. Get regular exercise Get regular exercise. This is one of the most important things you can do for your health. Most adults should:  Exercise for at least 150 minutes each week. The exercise should increase your heart rate and make you sweat (moderate-intensity exercise).  Do strengthening exercises at least twice a week. This is in addition to the moderate-intensity exercise.  Spend less time sitting. Even light physical activity can be beneficial. Watch cholesterol and blood lipids Have your blood tested for lipids and cholesterol at 58 years of age, then have this test every 5 years. Have your cholesterol levels checked more often if:  Your lipid or cholesterol levels are high.  You are older than 58 years of age.  You are at high risk for heart disease. What should I know about cancer screening? Depending on your health history and family history, you may need to have cancer screening at various ages. This may include screening for:  Breast cancer.  Cervical cancer.  Colorectal cancer.  Skin cancer.  Lung cancer. What should I know about heart disease, diabetes, and high blood  pressure? Blood pressure and heart disease  High blood pressure causes heart disease and increases the risk of stroke. This is more likely to develop in people who have high blood pressure readings, are of African descent, or are overweight.  Have your blood pressure checked: ? Every 3-5 years if you are 18-39 years of age. ? Every year if you are 40 years old or older. Diabetes Have regular diabetes screenings. This checks your fasting blood sugar level. Have the screening done:  Once every three years after age 40 if you are at a normal weight and have a low risk for diabetes.  More often and at a younger age if you are overweight or have a high risk for diabetes. What should I know about preventing infection? Hepatitis B If you have a higher risk for hepatitis B, you should be screened for this virus. Talk with your health care provider to find out if you are at risk for hepatitis B infection. Hepatitis C Testing is recommended for:  Everyone born from 1945 through 1965.  Anyone with known risk factors for hepatitis C. Sexually transmitted infections (STIs)  Get screened for STIs, including gonorrhea and chlamydia, if: ? You are sexually active and are younger than 58 years of age. ? You are older than 58 years of age and your health care provider tells you that you are at risk for this type of infection. ? Your sexual activity has changed since you were last screened, and you are at increased risk for chlamydia or gonorrhea. Ask your health care provider   if you are at risk.  Ask your health care provider about whether you are at high risk for HIV. Your health care provider may recommend a prescription medicine to help prevent HIV infection. If you choose to take medicine to prevent HIV, you should first get tested for HIV. You should then be tested every 3 months for as long as you are taking the medicine. Pregnancy  If you are about to stop having your period (premenopausal) and  you may become pregnant, seek counseling before you get pregnant.  Take 400 to 800 micrograms (mcg) of folic acid every day if you become pregnant.  Ask for birth control (contraception) if you want to prevent pregnancy. Osteoporosis and menopause Osteoporosis is a disease in which the bones lose minerals and strength with aging. This can result in bone fractures. If you are 65 years old or older, or if you are at risk for osteoporosis and fractures, ask your health care provider if you should:  Be screened for bone loss.  Take a calcium or vitamin D supplement to lower your risk of fractures.  Be given hormone replacement therapy (HRT) to treat symptoms of menopause. Follow these instructions at home: Lifestyle  Do not use any products that contain nicotine or tobacco, such as cigarettes, e-cigarettes, and chewing tobacco. If you need help quitting, ask your health care provider.  Do not use street drugs.  Do not share needles.  Ask your health care provider for help if you need support or information about quitting drugs. Alcohol use  Do not drink alcohol if: ? Your health care provider tells you not to drink. ? You are pregnant, may be pregnant, or are planning to become pregnant.  If you drink alcohol: ? Limit how much you use to 0-1 drink a day. ? Limit intake if you are breastfeeding.  Be aware of how much alcohol is in your drink. In the [place] one drink equals one 12 oz bottle of beer (355 mL), one 5 oz glass of wine (148 mL), or one 1 oz glass of hard liquor (44 mL). General instructions  Schedule regular health, dental, and eye exams.  Stay current with your vaccines.  Tell your health care provider if: ? You often feel depressed. ? You have ever been abused or do not feel safe at home. Summary  Adopting a healthy lifestyle and getting preventive care are important in promoting health and wellness.  Follow your health care provider's instructions about healthy  diet, exercising, and getting tested or screened for diseases.  Follow your health care provider's instructions on monitoring your cholesterol and blood pressure. This information is not intended to replace advice given to you by your health care provider. Make sure you discuss any questions you have with your health care provider. Document Revised: 08/09/2018 Document Reviewed: 08/09/2018 Elsevier Patient Education  2021 Elsevier Inc.  

## 2020-11-27 LAB — HEPATITIS C ANTIBODY
Hepatitis C Ab: NONREACTIVE
SIGNAL TO CUT-OFF: 0 (ref <1.00)

## 2020-11-27 LAB — HIV ANTIBODY (ROUTINE TESTING W REFLEX): HIV 1&2 Ab, 4th Generation: NONREACTIVE

## 2020-11-27 MED ORDER — ESOMEPRAZOLE MAGNESIUM 20 MG PO CPDR: 20.0000 mg | DELAYED_RELEASE_CAPSULE | Freq: Two times a day (BID) | ORAL | 1 refills | Status: DC

## 2020-11-27 MED ORDER — ROSUVASTATIN CALCIUM 40 MG PO TABS: 40.0000 mg | ORAL_TABLET | Freq: Every day | ORAL | 3 refills | Status: DC

## 2020-12-12 ENCOUNTER — Ambulatory Visit (HOSPITAL_BASED_OUTPATIENT_CLINIC_OR_DEPARTMENT_OTHER)
Admission: RE | Admit: 2020-12-12 | Discharge: 2020-12-12 | Disposition: A | Discharge status: Home / Self Care | Payer: Federal, State, Local not specified - PPO | Source: Ambulatory Visit | Attending: Family Medicine | Admitting: Family Medicine

## 2020-12-12 ENCOUNTER — Other Ambulatory Visit: Payer: Self-pay

## 2020-12-12 DIAGNOSIS — Z1231 Encounter for screening mammogram for malignant neoplasm of breast: Secondary | ICD-10-CM | POA: Insufficient documentation

## 2020-12-22 ENCOUNTER — Encounter: Payer: Self-pay | Admitting: Family Medicine

## 2020-12-22 DIAGNOSIS — R928 Other abnormal and inconclusive findings on diagnostic imaging of breast: Secondary | ICD-10-CM | POA: Insufficient documentation

## 2020-12-23 ENCOUNTER — Other Ambulatory Visit: Payer: Self-pay | Admitting: Family Medicine

## 2020-12-23 DIAGNOSIS — R928 Other abnormal and inconclusive findings on diagnostic imaging of breast: Secondary | ICD-10-CM

## 2021-01-13 ENCOUNTER — Inpatient Hospital Stay: Admission: RE | Admit: 2021-01-13 | Payer: Federal, State, Local not specified - PPO | Source: Ambulatory Visit

## 2021-01-13 ENCOUNTER — Other Ambulatory Visit: Payer: Federal, State, Local not specified - PPO

## 2021-01-14 ENCOUNTER — Other Ambulatory Visit: Payer: Self-pay | Admitting: Family Medicine

## 2021-01-15 ENCOUNTER — Telehealth: Payer: Self-pay | Admitting: Gastroenterology

## 2021-01-15 NOTE — Telephone Encounter (Signed)
Started PA on covermymeds  Key: [id number]

## 2021-01-15 NOTE — Telephone Encounter (Signed)
Good evening Dr. Silverio Decamp,   We received a referral in our office for colonoscopy. Patient had a previous procedure done 3/4 years ago in [place], [place]. She states she have a history of polyps and on a 3 year recall. Could you please review and advise on scheduling patient?  Thank you.

## 2021-01-15 NOTE — Telephone Encounter (Signed)
PA approved.

## 2021-01-21 ENCOUNTER — Encounter: Payer: Self-pay | Admitting: Gastroenterology

## 2021-01-21 NOTE — Telephone Encounter (Signed)
Please schedule direct colonoscopy , due for recall. H/o adenomatous colon polyps and family h/o colon cancer. Thanks

## 2021-01-21 NOTE — Telephone Encounter (Signed)
Thank you. PV scheduled 8/8. Direct colon 8/22.

## 2021-01-23 ENCOUNTER — Emergency Department
Admission: RE | Admit: 2021-01-23 | Discharge: 2021-01-23 | Disposition: A | Discharge status: Home / Self Care | Payer: Federal, State, Local not specified - PPO | Source: Ambulatory Visit

## 2021-01-23 ENCOUNTER — Other Ambulatory Visit: Payer: Self-pay

## 2021-01-23 VITALS — BP 152/95 | HR 93 | Temp 98.5°F | Resp 18

## 2021-01-23 DIAGNOSIS — J309 Allergic rhinitis, unspecified: Secondary | ICD-10-CM

## 2021-01-23 DIAGNOSIS — R059 Cough, unspecified: Secondary | ICD-10-CM

## 2021-01-23 DIAGNOSIS — J0100 Acute maxillary sinusitis, unspecified: Secondary | ICD-10-CM

## 2021-01-23 MED ORDER — DOXYCYCLINE HYCLATE 100 MG PO CAPS: 100.0000 mg | ORAL_CAPSULE | Freq: Two times a day (BID) | ORAL | 0 refills | Status: AC

## 2021-01-23 MED ORDER — METHYLPREDNISOLONE ACETATE 80 MG/ML IJ SUSP
80.0000 mg | Freq: Once | INTRAMUSCULAR | Status: AC
  Administered 2021-01-23: 80 mg via INTRAMUSCULAR

## 2021-01-23 MED ORDER — PREDNISONE 20 MG PO TABS: ORAL_TABLET | ORAL | 0 refills | Status: DC

## 2021-01-23 MED ORDER — FEXOFENADINE HCL 180 MG PO TABS: 180.0000 mg | ORAL_TABLET | Freq: Every day | ORAL | 0 refills | Status: DC

## 2021-01-23 MED ORDER — BENZONATATE 200 MG PO CAPS: 200.0000 mg | ORAL_CAPSULE | Freq: Three times a day (TID) | ORAL | 0 refills | Status: AC | PRN
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
## 2021-01-23 NOTE — Discharge Instructions (Signed)
Advised patient to take medication as directed with food to completion.  Instructed patient not to start oral prednisone burst until tomorrow, Saturday, 01/24/2021.  Advised patient to take Allegra 180 mg daily for the next 5 to 7 days, then as needed. Advised patient may use Tessalon Perles daily or as needed for cough.

## 2021-01-23 NOTE — ED Triage Notes (Signed)
Pt c/o cough x 2 weeks. Congestion as well as shortness of breath. Taking mucinex and nyquil prn. Hx of minor asthma. Using inhaler prn.  Does mention she had bodyaches and fever last week but has since resolved.

## 2021-01-25 LAB — COVID-19, FLU A+B NAA
Influenza A, NAA: NOT DETECTED
Influenza B, NAA: NOT DETECTED
SARS-CoV-2, NAA: NOT DETECTED

## 2021-01-31 ENCOUNTER — Other Ambulatory Visit: Payer: Self-pay

## 2021-01-31 ENCOUNTER — Emergency Department (HOSPITAL_BASED_OUTPATIENT_CLINIC_OR_DEPARTMENT_OTHER)
Admission: EM | Admit: 2021-01-31 | Discharge: 2021-01-31 | Disposition: A | Discharge status: Home / Self Care | Payer: Federal, State, Local not specified - PPO | Attending: Emergency Medicine | Admitting: Emergency Medicine

## 2021-01-31 ENCOUNTER — Encounter (HOSPITAL_BASED_OUTPATIENT_CLINIC_OR_DEPARTMENT_OTHER): Payer: Self-pay | Admitting: Emergency Medicine

## 2021-01-31 ENCOUNTER — Emergency Department (HOSPITAL_BASED_OUTPATIENT_CLINIC_OR_DEPARTMENT_OTHER): Payer: Federal, State, Local not specified - PPO

## 2021-01-31 DIAGNOSIS — J45909 Unspecified asthma, uncomplicated: Secondary | ICD-10-CM | POA: Insufficient documentation

## 2021-01-31 DIAGNOSIS — Z7951 Long term (current) use of inhaled steroids: Secondary | ICD-10-CM | POA: Diagnosis not present

## 2021-01-31 DIAGNOSIS — I10 Essential (primary) hypertension: Secondary | ICD-10-CM | POA: Insufficient documentation

## 2021-01-31 DIAGNOSIS — Z91010 Allergy to peanuts: Secondary | ICD-10-CM | POA: Insufficient documentation

## 2021-01-31 DIAGNOSIS — K449 Diaphragmatic hernia without obstruction or gangrene: Secondary | ICD-10-CM | POA: Diagnosis not present

## 2021-01-31 DIAGNOSIS — R109 Unspecified abdominal pain: Secondary | ICD-10-CM | POA: Diagnosis not present

## 2021-01-31 DIAGNOSIS — M8448XA Pathological fracture, other site, initial encounter for fracture: Secondary | ICD-10-CM | POA: Diagnosis not present

## 2021-01-31 DIAGNOSIS — N200 Calculus of kidney: Secondary | ICD-10-CM | POA: Diagnosis not present

## 2021-01-31 DIAGNOSIS — K8020 Calculus of gallbladder without cholecystitis without obstruction: Secondary | ICD-10-CM | POA: Diagnosis not present

## 2021-01-31 LAB — CBC WITH DIFFERENTIAL/PLATELET
Abs Immature Granulocytes: 0.03 10*3/uL (ref 0.00–0.07)
Basophils Absolute: 0 10*3/uL (ref 0.0–0.1)
Basophils Relative: 0 %
Eosinophils Absolute: 0.3 10*3/uL (ref 0.0–0.5)
Eosinophils Relative: 3 %
HCT: 41.8 % (ref 36.0–46.0)
Hemoglobin: 14.3 g/dL (ref 12.0–15.0)
Immature Granulocytes: 0 %
Lymphocytes Relative: 15 %
Lymphs Abs: 1.4 10*3/uL (ref 0.7–4.0)
MCH: 33 pg (ref 26.0–34.0)
MCHC: 34.2 g/dL (ref 30.0–36.0)
MCV: 96.5 fL (ref 80.0–100.0)
Monocytes Absolute: 0.6 10*3/uL (ref 0.1–1.0)
Monocytes Relative: 7 %
Neutro Abs: 6.9 10*3/uL (ref 1.7–7.7)
Neutrophils Relative %: 75 %
Platelets: 261 10*3/uL (ref 150–400)
RBC: 4.33 MIL/uL (ref 3.87–5.11)
RDW: 13.4 % (ref 11.5–15.5)
WBC: 9.2 10*3/uL (ref 4.0–10.5)
nRBC: 0 % (ref 0.0–0.2)

## 2021-01-31 LAB — BASIC METABOLIC PANEL
Anion gap: 10 (ref 5–15)
BUN: 20 mg/dL (ref 6–20)
CO2: 27 mmol/L (ref 22–32)
Calcium: 9 mg/dL (ref 8.9–10.3)
Chloride: 103 mmol/L (ref 98–111)
Creatinine, Ser: 0.91 mg/dL (ref 0.44–1.00)
GFR, Estimated: >60 mL/min (ref >60)
Glucose, Bld: 94 mg/dL (ref 70–99)
Potassium: 4.1 mmol/L (ref 3.5–5.1)
Sodium: 140 mmol/L (ref 135–145)

## 2021-01-31 LAB — URINALYSIS, ROUTINE W REFLEX MICROSCOPIC
Bilirubin Urine: NEGATIVE
Glucose, UA: NEGATIVE mg/dL
Hgb urine dipstick: NEGATIVE
Ketones, ur: NEGATIVE mg/dL
Leukocytes,Ua: NEGATIVE
Nitrite: NEGATIVE
Protein, ur: NEGATIVE mg/dL
Specific Gravity, Urine: 1.02 (ref 1.005–1.030)
pH: 7 (ref 5.0–8.0)

## 2021-01-31 MED ORDER — KETOROLAC TROMETHAMINE 15 MG/ML IJ SOLN
15.0000 mg | Freq: Once | INTRAMUSCULAR | Status: AC
  Administered 2021-01-31: 15 mg via INTRAVENOUS
  Filled 2021-01-31: qty 1

## 2021-01-31 MED ORDER — SODIUM CHLORIDE 0.9 % IV BOLUS
1000.0000 mL | Freq: Once | INTRAVENOUS | Status: AC
  Administered 2021-01-31: 1000 mL via INTRAVENOUS

## 2021-01-31 MED ORDER — ONDANSETRON HCL 4 MG/2ML IJ SOLN
4.0000 mg | Freq: Once | INTRAMUSCULAR | Status: AC
  Administered 2021-01-31: 4 mg via INTRAVENOUS
  Filled 2021-01-31: qty 2

## 2021-01-31 MED ORDER — KETOROLAC TROMETHAMINE 10 MG PO TABS: 10.0000 mg | ORAL_TABLET | Freq: Four times a day (QID) | ORAL | 0 refills | Status: AC | PRN
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
## 2021-01-31 NOTE — ED Notes (Signed)
Patient transported to CT 

## 2021-01-31 NOTE — Discharge Instructions (Addendum)
Call your primary care doctor or specialist as discussed in the next 2-3 days.   Return immediately back to the ER if:  Your symptoms worsen within the next 12-24 hours. You develop new symptoms such as new fevers, persistent vomiting, new pain, shortness of breath, or new weakness or numbness, or if you have any other concerns.  

## 2021-01-31 NOTE — ED Triage Notes (Signed)
Pt arrives pov with c/o R flank pain that started last night. Pt denies dysuria, endorses N/V

## 2021-02-03 ENCOUNTER — Ambulatory Visit: Payer: Federal, State, Local not specified - PPO

## 2021-02-03 ENCOUNTER — Other Ambulatory Visit: Payer: Self-pay

## 2021-02-03 ENCOUNTER — Ambulatory Visit
Admission: RE | Admit: 2021-02-03 | Discharge: 2021-02-03 | Disposition: A | Discharge status: Home / Self Care | Payer: Federal, State, Local not specified - PPO | Source: Ambulatory Visit | Attending: Family Medicine | Admitting: Family Medicine

## 2021-02-03 DIAGNOSIS — R922 Inconclusive mammogram: Secondary | ICD-10-CM | POA: Diagnosis not present

## 2021-02-03 DIAGNOSIS — R928 Other abnormal and inconclusive findings on diagnostic imaging of breast: Secondary | ICD-10-CM | POA: Diagnosis not present

## 2021-03-11 DIAGNOSIS — N200 Calculus of kidney: Secondary | ICD-10-CM | POA: Diagnosis not present

## 2021-04-20 ENCOUNTER — Encounter: Payer: Federal, State, Local not specified - PPO | Admitting: Gastroenterology

## 2021-04-23 ENCOUNTER — Other Ambulatory Visit: Payer: Self-pay

## 2021-04-23 ENCOUNTER — Ambulatory Visit (AMBULATORY_SURGERY_CENTER): Payer: Federal, State, Local not specified - PPO

## 2021-04-23 VITALS — Ht 61.5 in | Wt 200.0 lb

## 2021-04-23 DIAGNOSIS — Z86010 Personal history of colonic polyps: Secondary | ICD-10-CM

## 2021-04-23 DIAGNOSIS — Z800 Family history of malignant neoplasm of digestive organs: Secondary | ICD-10-CM

## 2021-04-23 MED ORDER — PLENVU 140 G PO SOLR: 1.0000 | ORAL | 0 refills | Status: DC

## 2021-04-23 NOTE — Progress Notes (Signed)
Patient's pre-visit was done today over the phone with the patient  Name,DOB and address verified.  Patient denies any allergies to Eggs and Soy. Patient denies any problems with anesthesia/sedation. Patient denies taking diet pills or blood thinners. No home Oxygen. Packet of Prep instructions mailed to patient including a copy of a consent form-pt is aware. Patient understands to call us back with any questions or concerns. Patient is aware of our care-partner policy and 0000000 safety protocol.   EMMI education assigned to the patient for the procedure, sent to [place].   The patient is COVID-19 vaccinated.     Instructed to stop metamucil x 5 days before procedure.

## 2021-04-24 DIAGNOSIS — H6692 Otitis media, unspecified, left ear: Secondary | ICD-10-CM | POA: Diagnosis not present

## 2021-04-24 DIAGNOSIS — J019 Acute sinusitis, unspecified: Secondary | ICD-10-CM | POA: Diagnosis not present

## 2021-04-24 DIAGNOSIS — Z20822 Contact with and (suspected) exposure to covid-19: Secondary | ICD-10-CM | POA: Diagnosis not present

## 2021-04-24 DIAGNOSIS — H6691 Otitis media, unspecified, right ear: Secondary | ICD-10-CM | POA: Diagnosis not present

## 2021-05-13 ENCOUNTER — Ambulatory Visit: Payer: Federal, State, Local not specified - PPO | Admitting: Family Medicine

## 2021-06-01 ENCOUNTER — Telehealth: Payer: Self-pay | Admitting: Gastroenterology

## 2021-06-01 ENCOUNTER — Encounter: Payer: Federal, State, Local not specified - PPO | Admitting: Gastroenterology

## 2021-06-01 NOTE — Telephone Encounter (Signed)
Good Morning Dr. Silverio Decamp...    This pt called to cancel her procedure today. Pt stated that she has tested positive for Covid and will call back tomorrow to reschedule her appt. Thank you.

## 2021-06-01 NOTE — Telephone Encounter (Signed)
ok 

## 2021-06-09 ENCOUNTER — Other Ambulatory Visit: Payer: Self-pay | Admitting: Family Medicine

## 2021-06-10 ENCOUNTER — Encounter: Payer: Self-pay | Admitting: Family Medicine

## 2021-06-10 ENCOUNTER — Ambulatory Visit: Payer: Federal, State, Local not specified - PPO | Admitting: Family Medicine

## 2021-06-10 ENCOUNTER — Other Ambulatory Visit: Payer: Self-pay

## 2021-06-10 VITALS — BP 144/86 | HR 93 | Temp 98.2°F | Wt 200.2 lb

## 2021-06-10 DIAGNOSIS — K219 Gastro-esophageal reflux disease without esophagitis: Secondary | ICD-10-CM | POA: Diagnosis not present

## 2021-06-10 DIAGNOSIS — M545 Low back pain, unspecified: Secondary | ICD-10-CM | POA: Diagnosis not present

## 2021-06-10 DIAGNOSIS — Z23 Encounter for immunization: Secondary | ICD-10-CM

## 2021-06-10 DIAGNOSIS — N200 Calculus of kidney: Secondary | ICD-10-CM | POA: Insufficient documentation

## 2021-06-10 DIAGNOSIS — M4316 Spondylolisthesis, lumbar region: Secondary | ICD-10-CM

## 2021-06-10 DIAGNOSIS — M47816 Spondylosis without myelopathy or radiculopathy, lumbar region: Secondary | ICD-10-CM

## 2021-06-10 DIAGNOSIS — I10 Essential (primary) hypertension: Secondary | ICD-10-CM

## 2021-06-10 DIAGNOSIS — E782 Mixed hyperlipidemia: Secondary | ICD-10-CM | POA: Diagnosis not present

## 2021-06-10 HISTORY — DX: Calculus of kidney: N20.0

## 2021-06-10 MED ORDER — DICYCLOMINE HCL 10 MG PO CAPS: 10.0000 mg | ORAL_CAPSULE | Freq: Three times a day (TID) | ORAL | 5 refills | Status: DC

## 2021-06-10 MED ORDER — BUDESONIDE-FORMOTEROL FUMARATE 160-4.5 MCG/ACT IN AERO: 2.0000 | INHALATION_SPRAY | Freq: Two times a day (BID) | RESPIRATORY_TRACT | 11 refills | Status: DC

## 2021-06-10 MED ORDER — ROSUVASTATIN CALCIUM 40 MG PO TABS: 40.0000 mg | ORAL_TABLET | Freq: Every day | ORAL | 3 refills | Status: DC

## 2021-06-10 MED ORDER — AMLODIPINE BESYLATE 2.5 MG PO TABS: 2.5000 mg | ORAL_TABLET | Freq: Every day | ORAL | 1 refills | Status: DC

## 2021-06-10 MED ORDER — MELOXICAM 15 MG PO TABS: 15.0000 mg | ORAL_TABLET | Freq: Every day | ORAL | 5 refills | Status: DC

## 2021-06-10 MED ORDER — DIAZEPAM 10 MG PO TABS: 5.0000 mg | ORAL_TABLET | Freq: Every day | ORAL | 2 refills | Status: DC | PRN

## 2021-06-10 MED ORDER — ONDANSETRON HCL 4 MG PO TABS: 4.0000 mg | ORAL_TABLET | Freq: Three times a day (TID) | ORAL | 5 refills | Status: DC | PRN

## 2021-06-10 MED ORDER — ESOMEPRAZOLE MAGNESIUM 20 MG PO CPDR: 20.0000 mg | DELAYED_RELEASE_CAPSULE | Freq: Two times a day (BID) | ORAL | 1 refills | Status: DC

## 2021-06-10 MED ORDER — VENLAFAXINE HCL ER 150 MG PO TB24: 1.0000 | ORAL_TABLET | Freq: Every day | ORAL | 1 refills | Status: DC
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
## 2021-06-10 NOTE — Patient Instructions (Signed)
Great to see you today.  I have refilled the medication(s) we provide.   If labs were collected, we will inform you of lab results once received either by echart message or telephone call.   - echart message- for normal results that have been seen by the patient already.   - telephone call: abnormal results or if patient has not viewed results in their echart.  

## 2021-06-10 NOTE — Telephone Encounter (Signed)
Pt has pended in appt today

## 2021-07-03 ENCOUNTER — Ambulatory Visit (AMBULATORY_SURGERY_CENTER): Payer: Federal, State, Local not specified - PPO | Admitting: Gastroenterology

## 2021-07-03 ENCOUNTER — Other Ambulatory Visit: Payer: Self-pay

## 2021-07-03 ENCOUNTER — Encounter: Payer: Self-pay | Admitting: Gastroenterology

## 2021-07-03 VITALS — BP 136/85 | HR 82 | Temp 98.0°F | Resp 24 | Ht 61.0 in | Wt 200.0 lb

## 2021-07-03 DIAGNOSIS — Z86010 Personal history of colonic polyps: Secondary | ICD-10-CM | POA: Diagnosis not present

## 2021-07-03 DIAGNOSIS — D123 Benign neoplasm of transverse colon: Secondary | ICD-10-CM | POA: Diagnosis not present

## 2021-07-03 DIAGNOSIS — Z1211 Encounter for screening for malignant neoplasm of colon: Secondary | ICD-10-CM | POA: Diagnosis not present

## 2021-07-03 DIAGNOSIS — Z800 Family history of malignant neoplasm of digestive organs: Secondary | ICD-10-CM

## 2021-07-03 MED ORDER — SODIUM CHLORIDE 0.9 % IV SOLN: 500.0000 mL | Freq: Once | INTRAVENOUS | Status: DC

## 2021-07-03 NOTE — Op Note (Signed)
[place] Patient Name: Diana Spencer Procedure Date: 07/03/2021 2:11 PM MRN: [id number] Endoscopist: Mauri Pole , MD Age: 60 Referring MD:  Date of Birth: [birth year] Gender: Female Account #: [id number] Procedure:                Colonoscopy Indications:              Colon cancer screening in patient at increased                            risk: Family history of colorectal cancer in                            multiple 2nd degree relatives, High risk colon                            cancer surveillance: Personal history of colonic                            polyps, High risk colon cancer surveillance:                            Personal history of adenoma less than 10 mm in size Medicines:                Monitored Anesthesia Care Procedure:                Pre-Anesthesia Assessment:                           - Prior to the procedure, a History and Physical                            was performed, and patient medications and                            allergies were reviewed. The patient's tolerance of                            previous anesthesia was also reviewed. The risks                            and benefits of the procedure and the sedation                            options and risks were discussed with the patient.                            All questions were answered, and informed consent                            was obtained. Prior Anticoagulants: The patient has                            taken no previous anticoagulant or antiplatelet  agents. ASA Grade Assessment: II - A patient with                            mild systemic disease. After reviewing the risks                            and benefits, the patient was deemed in                            satisfactory condition to undergo the procedure.                           After obtaining informed consent, the colonoscope                            was passed under  direct vision. Throughout the                            procedure, the patient's blood pressure, pulse, and                            oxygen saturations were monitored continuously. The                            Olympus PCF-H190DL ([id number]) Colonoscope was                            introduced through the anus and advanced to the the                            cecum, identified by appendiceal orifice and                            ileocecal valve. The colonoscopy was performed                            without difficulty. The patient tolerated the                            procedure well. The quality of the bowel                            preparation was good. The ileocecal valve,                            appendiceal orifice, and rectum were photographed. Scope In: 2:25:19 PM Scope Out: 2:40:29 PM Scope Withdrawal Time: 0 hours 10 minutes 8 seconds  Total Procedure Duration: 0 hours 15 minutes 10 seconds  Findings:                 The perianal and digital rectal examinations were                            normal.  A 6 mm polyp was found in the transverse colon. The                            polyp was sessile. The polyp was removed with a                            cold snare. Resection and retrieval were complete.                           Multiple small and large-mouthed diverticula were                            found in the sigmoid colon and descending colon.                            There was narrowing of the colon in association                            with the diverticular opening. There was evidence                            of diverticular spasm. Peri-diverticular erythema                            was seen. There was no evidence of diverticular                            bleeding.                           Non-bleeding external and internal hemorrhoids were                            found during retroflexion. The hemorrhoids were                             small. Complications:            No immediate complications. Estimated Blood Loss:     Estimated blood loss was minimal. Impression:               - One 6 mm polyp in the transverse colon, removed                            with a cold snare. Resected and retrieved.                           - Moderate diverticulosis in the sigmoid colon and                            in the descending colon. There was narrowing of the                            colon in association with the diverticular opening.  There was evidence of diverticular spasm.                            Peri-diverticular erythema was seen. There was no                            evidence of diverticular bleeding.                           - Non-bleeding external and internal hemorrhoids. Recommendation:           - Patient has a contact number available for                            emergencies. The signs and symptoms of potential                            delayed complications were discussed with the                            patient. Return to normal activities tomorrow.                            Written discharge instructions were provided to the                            patient.                           - Resume previous diet.                           - Continue present medications.                           - Await pathology results.                           - Repeat colonoscopy in 5 years for surveillance                            based on pathology results. Mauri Pole, MD 07/03/2021 2:45:08 PM This report has been signed electronically.

## 2021-07-03 NOTE — Progress Notes (Signed)
Called to room to assist during endoscopic procedure.  Patient ID and intended procedure confirmed with present staff. Received instructions for my participation in the procedure from the performing physician.  

## 2021-07-03 NOTE — Progress Notes (Signed)
Sedate, gd SR, tolerated procedure well, VSS, report to RN 

## 2021-07-03 NOTE — Patient Instructions (Signed)
YOU HAD AN ENDOSCOPIC PROCEDURE TODAY AT THE [facility]:   Refer to the procedure report that was given to you for any specific questions about what was found during the examination.  If the procedure report does not answer your questions, please call your gastroenterologist to clarify.  If you requested that your care partner not be given the details of your procedure findings, then the procedure report has been included in a sealed envelope for you to review at your convenience later. ° °**Handouts given on polyps, diverticulosis and hemorrhoids** ° °YOU SHOULD EXPECT: Some feelings of bloating in the abdomen. Passage of more gas than usual.  Walking can help get rid of the air that was put into your GI tract during the procedure and reduce the bloating. If you had a lower endoscopy (such as a colonoscopy or flexible sigmoidoscopy) you may notice spotting of blood in your stool or on the toilet paper. If you underwent a bowel prep for your procedure, you may not have a normal bowel movement for a few days. ° °Please Note:  You might notice some irritation and congestion in your nose or some drainage.  This is from the oxygen used during your procedure.  There is no need for concern and it should clear up in a day or so. ° °SYMPTOMS TO REPORT IMMEDIATELY: ° °Following lower endoscopy (colonoscopy or flexible sigmoidoscopy): ° Excessive amounts of blood in the stool ° Significant tenderness or worsening of abdominal pains ° Swelling of the abdomen that is new, acute ° Fever of 100°F or higher ° °For urgent or emergent issues, a gastroenterologist can be reached at any hour by calling [phone]. °Do not use MyChart messaging for urgent concerns.  ° ° °DIET:  We do recommend a small meal at first, but then you may proceed to your regular diet.  Drink plenty of fluids but you should avoid alcoholic beverages for 24 hours. ° °ACTIVITY:  You should plan to take it easy for the rest of today and you  should NOT DRIVE or use heavy machinery until tomorrow (because of the sedation medicines used during the test).   ° °FOLLOW UP: °Our staff will call the number listed on your records 48-72 hours following your procedure to check on you and address any questions or concerns that you may have regarding the information given to you following your procedure. If we do not reach you, we will leave a message.  We will attempt to reach you two times.  During this call, we will ask if you have developed any symptoms of COVID 19. If you develop any symptoms (ie: fever, flu-like symptoms, shortness of breath, cough etc.) before then, please call [phone].  If you test positive for Covid 19 in the 2 weeks post procedure, please call and report this information to us.   ° °If any biopsies were taken you will be contacted by phone or by letter within the next 1-3 weeks.  Please call us at [phone] if you have not heard about the biopsies in 3 weeks.  ° ° °SIGNATURES/CONFIDENTIALITY: °You and/or your care partner have signed paperwork which will be entered into your electronic medical record.  These signatures attest to the fact that that the information above on your After Visit Summary has been reviewed and is understood.  Full responsibility of the confidentiality of this discharge information lies with you and/or your care-partner.  °

## 2021-07-03 NOTE — Progress Notes (Signed)
VS completed by DT.  Pt's states no medical or surgical changes since previsit or office visit.  
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
## 2021-07-07 ENCOUNTER — Telehealth: Payer: Self-pay

## 2021-07-07 NOTE — Telephone Encounter (Signed)
  Follow up Call-  Call back number 07/03/2021  Post procedure Call Back phone  # [phone]  Permission to leave phone message Yes  Some recent data might be hidden     Patient questions:  Do you have a fever, pain , or abdominal swelling? No. Pain Score  0 *  Have you tolerated food without any problems? Yes.    Have you been able to return to your normal activities? Yes.    Do you have any questions about your discharge instructions: Diet   No. Medications  No. Follow up visit  No.  Do you have questions or concerns about your Care? No.  Actions: * If pain score is 4 or above: No action needed, pain <4.

## 2021-07-17 ENCOUNTER — Encounter: Payer: Self-pay | Admitting: Gastroenterology

## 2021-11-23 ENCOUNTER — Other Ambulatory Visit: Payer: Self-pay

## 2021-11-23 MED ORDER — VENLAFAXINE HCL ER 150 MG PO TB24: 1.0000 | ORAL_TABLET | Freq: Every day | ORAL | 0 refills | Status: DC

## 2021-12-09 ENCOUNTER — Ambulatory Visit: Payer: Federal, State, Local not specified - PPO | Admitting: Family Medicine

## 2021-12-09 ENCOUNTER — Encounter: Payer: Self-pay | Admitting: Family Medicine

## 2021-12-09 DIAGNOSIS — Z79899 Other long term (current) drug therapy: Secondary | ICD-10-CM

## 2021-12-09 DIAGNOSIS — M79675 Pain in left toe(s): Secondary | ICD-10-CM | POA: Diagnosis not present

## 2021-12-09 DIAGNOSIS — Z5181 Encounter for therapeutic drug level monitoring: Secondary | ICD-10-CM | POA: Diagnosis not present

## 2021-12-09 DIAGNOSIS — K219 Gastro-esophageal reflux disease without esophagitis: Secondary | ICD-10-CM | POA: Diagnosis not present

## 2021-12-09 DIAGNOSIS — E782 Mixed hyperlipidemia: Secondary | ICD-10-CM

## 2021-12-09 DIAGNOSIS — I10 Essential (primary) hypertension: Secondary | ICD-10-CM | POA: Diagnosis not present

## 2021-12-09 DIAGNOSIS — J4530 Mild persistent asthma, uncomplicated: Secondary | ICD-10-CM

## 2021-12-09 DIAGNOSIS — E870 Hyperosmolality and hypernatremia: Secondary | ICD-10-CM

## 2021-12-09 DIAGNOSIS — E875 Hyperkalemia: Secondary | ICD-10-CM

## 2021-12-09 DIAGNOSIS — F330 Major depressive disorder, recurrent, mild: Secondary | ICD-10-CM

## 2021-12-09 DIAGNOSIS — F419 Anxiety disorder, unspecified: Secondary | ICD-10-CM | POA: Insufficient documentation

## 2021-12-09 LAB — COMPREHENSIVE METABOLIC PANEL
ALT: 32 U/L (ref 0–35)
AST: 35 U/L (ref 0–37)
Albumin: 4.5 g/dL (ref 3.5–5.2)
Alkaline Phosphatase: 87 U/L (ref 39–117)
BUN: 14 mg/dL (ref 6–23)
CO2: 26 mEq/L (ref 19–32)
Calcium: 9.2 mg/dL (ref 8.4–10.5)
Chloride: 103 mEq/L (ref 96–112)
Creatinine, Ser: 0.87 mg/dL (ref 0.40–1.20)
GFR: 73.33 mL/min (ref >60.00)
Glucose, Bld: 98 mg/dL (ref 70–99)
Potassium: 3.8 mEq/L (ref 3.5–5.1)
Sodium: 141 mEq/L (ref 135–145)
Total Bilirubin: 1.2 mg/dL (ref 0.2–1.2)
Total Protein: 6.6 g/dL (ref 6.0–8.3)

## 2021-12-09 LAB — CBC
HCT: 41.1 % (ref 36.0–46.0)
Hemoglobin: 13.9 g/dL (ref 12.0–15.0)
MCHC: 34 g/dL (ref 30.0–36.0)
MCV: 96.7 fl (ref 78.0–100.0)
Platelets: 232 10*3/uL (ref 150.0–400.0)
RBC: 4.25 Mil/uL (ref 3.87–5.11)
RDW: 14.5 % (ref 11.5–15.5)
WBC: 5.7 10*3/uL (ref 4.0–10.5)

## 2021-12-09 LAB — LIPID PANEL
Cholesterol: 136 mg/dL (ref 0–200)
HDL: 57.9 mg/dL (ref >39.00)
LDL Cholesterol: 52 mg/dL (ref 0–99)
NonHDL: 78.5
Total CHOL/HDL Ratio: 2
Triglycerides: 132 mg/dL (ref 0.0–149.0)
VLDL: 26.4 mg/dL (ref 0.0–40.0)

## 2021-12-09 LAB — VITAMIN D 25 HYDROXY (VIT D DEFICIENCY, FRACTURES): VITD: 35.96 ng/mL (ref 30.00–100.00)

## 2021-12-09 LAB — TSH: TSH: 2.33 u[IU]/mL (ref 0.35–5.50)

## 2021-12-09 LAB — URIC ACID: Uric Acid, Serum: 5.5 mg/dL (ref 2.4–7.0)

## 2021-12-09 LAB — MAGNESIUM: Magnesium: 1.7 mg/dL (ref 1.5–2.5)

## 2021-12-09 LAB — VITAMIN B12: Vitamin B-12: 727 pg/mL (ref 211–911)

## 2021-12-09 MED ORDER — ONDANSETRON HCL 4 MG PO TABS: 4.0000 mg | ORAL_TABLET | Freq: Three times a day (TID) | ORAL | 5 refills | Status: DC | PRN

## 2021-12-09 MED ORDER — MELOXICAM 15 MG PO TABS: 15.0000 mg | ORAL_TABLET | Freq: Every day | ORAL | 1 refills | Status: DC

## 2021-12-09 MED ORDER — DIAZEPAM 10 MG PO TABS: 5.0000 mg | ORAL_TABLET | Freq: Every day | ORAL | 2 refills | Status: DC | PRN

## 2021-12-09 MED ORDER — ESOMEPRAZOLE MAGNESIUM 20 MG PO CPDR: 20.0000 mg | DELAYED_RELEASE_CAPSULE | Freq: Two times a day (BID) | ORAL | 1 refills | Status: DC

## 2021-12-09 MED ORDER — DICYCLOMINE HCL 10 MG PO CAPS: 10.0000 mg | ORAL_CAPSULE | Freq: Three times a day (TID) | ORAL | 1 refills | Status: DC

## 2021-12-09 MED ORDER — VENLAFAXINE HCL ER 150 MG PO TB24: 1.0000 | ORAL_TABLET | Freq: Every day | ORAL | 1 refills | Status: DC

## 2021-12-09 MED ORDER — AMLODIPINE BESYLATE 2.5 MG PO TABS: 2.5000 mg | ORAL_TABLET | Freq: Every day | ORAL | 1 refills | Status: DC

## 2021-12-09 MED ORDER — FAMOTIDINE 20 MG PO TABS: 20.0000 mg | ORAL_TABLET | Freq: Two times a day (BID) | ORAL | 1 refills | Status: DC

## 2021-12-09 MED ORDER — ROSUVASTATIN CALCIUM 40 MG PO TABS: 40.0000 mg | ORAL_TABLET | Freq: Every day | ORAL | 3 refills | Status: DC

## 2021-12-09 NOTE — Patient Instructions (Addendum)
?No follow-ups on file. ? ?Great to see you today.  ?I have refilled the medication(s) we provide.  ? ?If labs were collected, we will inform you of lab results once received either by echart message or telephone call.  ? - echart message- for normal results that have been seen by the patient already.  ? - telephone call: abnormal results or if patient has not viewed results in their echart. ? ?Low-Purine Eating Plan ?A low-purine eating plan involves making food choices to limit your intake of purine. Purine is a kind of uric acid. Too much uric acid in your blood can cause certain conditions, such as gout and kidney stones. Eating a low-purine diet can help control these conditions. ?What are tips for following this plan? ?Reading food labels ?Avoid foods with saturated or Trans fat. ?Check the ingredient list of grains-based foods, such as bread and cereal, to make sure that they contain whole grains. ?Check the ingredient list of sauces or soups to make sure they do not contain meat or fish. ?When choosing soft drinks, check the ingredient list to make sure they do not contain high-fructose corn syrup. ?Shopping ? ?Buy plenty of fresh fruits and vegetables. ?Avoid buying canned or fresh fish. ?Buy dairy products labeled as low-fat or nonfat. ?Avoid buying premade or processed foods. These foods are often high in fat, salt (sodium), and added sugar. ?Cooking ?Use olive oil instead of butter when cooking. Oils like olive oil, canola oil, and sunflower oil contain healthy fats. ?Meal planning ?Learn which foods do or do not affect you. If you find out that a food tends to cause your gout symptoms to flare up, avoid eating that food. You can enjoy foods that do not cause problems. If you have any questions about a food item, talk with your dietitian or health care provider. ?Limit foods high in fat, especially saturated fat. Fat makes it harder for your body to get rid of uric acid. ?Choose foods that are lower in  fat and are lean sources of protein. ?General guidelines ?Limit alcohol intake to no more than 1 drink a day for nonpregnant women and 2 drinks a day for men. One drink equals 12 oz of beer, 5 oz of wine, or 1? oz of hard liquor. Alcohol can affect the way your body gets rid of uric acid. ?Drink plenty of water to keep your urine clear or pale yellow. Fluids can help remove uric acid from your body. ?If directed by your health care provider, take a vitamin C supplement. ?Work with your health care provider and dietitian to develop a plan to achieve or maintain a healthy weight. Losing weight can help reduce uric acid in your blood. ?What foods are recommended? ?The items listed may not be a complete list. Talk with your dietitian about what dietary choices are best for you. ?Foods low in purines ?Foods low in purines do not need to be limited. These include: ?All fruits. ?All low-purine vegetables, pickles, and olives. ?Breads, pasta, rice, cornbread, and popcorn. Cake and other baked goods. ?All dairy foods. ?Eggs, nuts, and nut butters. ?Spices and condiments, such as salt, herbs, and vinegar. ?Plant oils, butter, and margarine. ?Water, sugar-free soft drinks, tea, coffee, and cocoa. ?Vegetable-based soups, broths, sauces, and gravies. ?Foods moderate in purines ?Foods moderate in purines should be limited to the amounts listed. ?? cup of asparagus, cauliflower, spinach, mushrooms, or green peas, each day. ?2/3 cup uncooked oatmeal, each day. ?? cup dry wheat bran or  wheat germ, each day. ?2-3 ounces of meat or poultry, each day. ?4-6 ounces of shellfish, such as crab, lobster, oysters, or shrimp, each day. ?1 cup cooked beans, peas, or lentils, each day. ?Soup, broths, or bouillon made from meat or fish. Limit these foods as much as possible. ?What foods are not recommended? ?The items listed may not be a complete list. Talk with your dietitian about what dietary choices are best for you. ?Limit your intake of  foods high in purines, including: ?Beer and other alcohol. ?Meat-based gravy or sauce. ?Canned or fresh fish, such as: ?Anchovies, sardines, herring, and tuna. ?Mussels and scallops. ?Codfish, trout, and haddock. ?Berniece Salines. ?Organ meats, such as: ?Liver or kidney. ?[place]. ?Sweetbreads (thymus gland or pancreas). ?[employer] or goose. ?Yeast or yeast extract supplements. ?Drinks sweetened with high-fructose corn syrup. ?Summary ?Eating a low-purine diet can help control conditions caused by too much uric acid in the body, such as gout or kidney stones. ?Choose low-purine foods, limit alcohol, and limit foods high in fat. ?You will learn over time which foods do or do not affect you. If you find out that a food tends to cause your gout symptoms to flare up, avoid eating that food. ?This information is not intended to replace advice given to you by your health care provider. Make sure you discuss any questions you have with your health care provider. ?Document Revised: 11/29/2019 Document Reviewed: 11/29/2019 ?Elsevier Patient Education ? 2022 [employer]. ? ? ?
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
## 2021-12-17 ENCOUNTER — Other Ambulatory Visit: Payer: Self-pay | Admitting: Family Medicine

## 2022-01-01 ENCOUNTER — Other Ambulatory Visit: Payer: Self-pay | Admitting: Family Medicine

## 2022-01-04 ENCOUNTER — Other Ambulatory Visit: Payer: Self-pay | Admitting: Family Medicine

## 2022-01-08 ENCOUNTER — Other Ambulatory Visit: Payer: Self-pay | Admitting: Family Medicine

## 2022-01-22 ENCOUNTER — Ambulatory Visit (INDEPENDENT_AMBULATORY_CARE_PROVIDER_SITE_OTHER): Payer: Federal, State, Local not specified - PPO

## 2022-01-22 ENCOUNTER — Ambulatory Visit (HOSPITAL_BASED_OUTPATIENT_CLINIC_OR_DEPARTMENT_OTHER): Payer: Federal, State, Local not specified - PPO | Admitting: Orthopaedic Surgery

## 2022-01-22 DIAGNOSIS — M25562 Pain in left knee: Secondary | ICD-10-CM

## 2022-01-22 DIAGNOSIS — S838X2A Sprain of other specified parts of left knee, initial encounter: Secondary | ICD-10-CM | POA: Diagnosis not present

## 2022-01-22 DIAGNOSIS — S8392XA Sprain of unspecified site of left knee, initial encounter: Secondary | ICD-10-CM | POA: Diagnosis not present
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
## 2022-01-27 ENCOUNTER — Ambulatory Visit: Payer: Federal, State, Local not specified - PPO | Admitting: Orthopedic Surgery

## 2022-02-01 ENCOUNTER — Ambulatory Visit
Admission: RE | Admit: 2022-02-01 | Discharge: 2022-02-01 | Disposition: A | Discharge status: Home / Self Care | Payer: Federal, State, Local not specified - PPO | Source: Ambulatory Visit | Attending: Orthopaedic Surgery | Admitting: Orthopaedic Surgery

## 2022-02-01 DIAGNOSIS — M25562 Pain in left knee: Secondary | ICD-10-CM | POA: Diagnosis not present

## 2022-02-01 DIAGNOSIS — S838X2A Sprain of other specified parts of left knee, initial encounter: Secondary | ICD-10-CM

## 2022-02-05 ENCOUNTER — Ambulatory Visit (INDEPENDENT_AMBULATORY_CARE_PROVIDER_SITE_OTHER): Payer: Federal, State, Local not specified - PPO | Admitting: Orthopaedic Surgery

## 2022-02-05 ENCOUNTER — Ambulatory Visit (INDEPENDENT_AMBULATORY_CARE_PROVIDER_SITE_OTHER): Payer: Federal, State, Local not specified - PPO

## 2022-02-05 DIAGNOSIS — M5459 Other low back pain: Secondary | ICD-10-CM

## 2022-02-05 DIAGNOSIS — G8929 Other chronic pain: Secondary | ICD-10-CM

## 2022-02-05 DIAGNOSIS — M5136 Other intervertebral disc degeneration, lumbar region: Secondary | ICD-10-CM | POA: Diagnosis not present

## 2022-02-05 DIAGNOSIS — M545 Low back pain, unspecified: Secondary | ICD-10-CM

## 2022-02-05 MED ORDER — LIDOCAINE HCL 1 % IJ SOLN
4.0000 mL | INTRAMUSCULAR | Status: AC | PRN
  Administered 2022-02-05: 4 mL

## 2022-02-05 MED ORDER — TRIAMCINOLONE ACETONIDE 40 MG/ML IJ SUSP
80.0000 mg | INTRAMUSCULAR | Status: AC | PRN
  Administered 2022-02-05: 80 mg via INTRA_ARTICULAR
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
## 2022-02-10 ENCOUNTER — Other Ambulatory Visit: Payer: Self-pay | Admitting: Family Medicine

## 2022-02-15 ENCOUNTER — Other Ambulatory Visit: Payer: Self-pay | Admitting: Family Medicine

## 2022-02-18 ENCOUNTER — Ambulatory Visit
Admission: RE | Admit: 2022-02-18 | Discharge: 2022-02-18 | Disposition: A | Discharge status: Home / Self Care | Payer: Federal, State, Local not specified - PPO | Source: Ambulatory Visit | Attending: Orthopaedic Surgery | Admitting: Orthopaedic Surgery

## 2022-02-18 DIAGNOSIS — M545 Low back pain, unspecified: Secondary | ICD-10-CM | POA: Diagnosis not present

## 2022-02-26 ENCOUNTER — Other Ambulatory Visit: Payer: Self-pay

## 2022-02-26 MED ORDER — ONDANSETRON HCL 4 MG PO TABS: 4.0000 mg | ORAL_TABLET | Freq: Three times a day (TID) | ORAL | 5 refills | Status: DC | PRN

## 2022-03-05 ENCOUNTER — Ambulatory Visit (HOSPITAL_BASED_OUTPATIENT_CLINIC_OR_DEPARTMENT_OTHER): Payer: Federal, State, Local not specified - PPO | Admitting: Orthopaedic Surgery

## 2022-03-05 DIAGNOSIS — M4317 Spondylolisthesis, lumbosacral region: Secondary | ICD-10-CM | POA: Diagnosis not present
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
## 2022-03-22 ENCOUNTER — Other Ambulatory Visit: Payer: Self-pay | Admitting: Family Medicine

## 2022-03-23 ENCOUNTER — Other Ambulatory Visit: Payer: Self-pay | Admitting: Family Medicine

## 2022-03-29 ENCOUNTER — Telehealth: Payer: Self-pay

## 2022-03-29 NOTE — Telephone Encounter (Signed)
Agreed, noted.

## 2022-03-29 NOTE — Telephone Encounter (Addendum)
Pt called she is needing Rx sent amLODipine (NORVASC) 2.5 MG tablet [[id number]] esomeprazole (NEXIUM) 20 MG capsule [[id number]] & Venlafaxine HCl 150 MG TB24 [[id number]]. Advised pt to call the pharmacy and have them look up the medication by name instead of by number. And if there is still complications getting refills to either call back or send MyChart message.

## 2022-04-06 ENCOUNTER — Other Ambulatory Visit: Payer: Self-pay | Admitting: Family Medicine

## 2022-05-26 ENCOUNTER — Encounter: Payer: Self-pay | Admitting: Family Medicine

## 2022-05-26 ENCOUNTER — Ambulatory Visit: Payer: Federal, State, Local not specified - PPO | Admitting: Family Medicine

## 2022-05-26 VITALS — BP 117/81 | HR 97 | Temp 97.6°F | Ht 61.0 in | Wt 213.0 lb

## 2022-05-26 DIAGNOSIS — Z23 Encounter for immunization: Secondary | ICD-10-CM

## 2022-05-26 DIAGNOSIS — K219 Gastro-esophageal reflux disease without esophagitis: Secondary | ICD-10-CM | POA: Diagnosis not present

## 2022-05-26 DIAGNOSIS — I10 Essential (primary) hypertension: Secondary | ICD-10-CM | POA: Diagnosis not present

## 2022-05-26 DIAGNOSIS — E782 Mixed hyperlipidemia: Secondary | ICD-10-CM

## 2022-05-26 DIAGNOSIS — J4530 Mild persistent asthma, uncomplicated: Secondary | ICD-10-CM | POA: Diagnosis not present

## 2022-05-26 MED ORDER — ONDANSETRON HCL 4 MG PO TABS: 4.0000 mg | ORAL_TABLET | Freq: Three times a day (TID) | ORAL | 5 refills | Status: DC | PRN

## 2022-05-26 MED ORDER — FAMOTIDINE 20 MG PO TABS: 20.0000 mg | ORAL_TABLET | Freq: Two times a day (BID) | ORAL | 1 refills | Status: DC

## 2022-05-26 MED ORDER — BUDESONIDE-FORMOTEROL FUMARATE 160-4.5 MCG/ACT IN AERO: 2.0000 | INHALATION_SPRAY | Freq: Two times a day (BID) | RESPIRATORY_TRACT | 11 refills | Status: DC

## 2022-05-26 MED ORDER — VENLAFAXINE HCL ER 75 MG PO CP24: 225.0000 mg | ORAL_CAPSULE | Freq: Every day | ORAL | 1 refills | Status: DC

## 2022-05-26 MED ORDER — DIAZEPAM 10 MG PO TABS: 5.0000 mg | ORAL_TABLET | Freq: Every day | ORAL | 5 refills | Status: DC | PRN

## 2022-05-26 MED ORDER — DICYCLOMINE HCL 10 MG PO CAPS: 10.0000 mg | ORAL_CAPSULE | Freq: Three times a day (TID) | ORAL | 1 refills | Status: DC

## 2022-05-26 MED ORDER — ESOMEPRAZOLE MAGNESIUM 20 MG PO CPDR
20.0000 mg | DELAYED_RELEASE_CAPSULE | Freq: Two times a day (BID) | ORAL | 1 refills | Status: DC
Start: 2022-05-26 — End: 2022-11-08

## 2022-05-26 MED ORDER — MELOXICAM 15 MG PO TABS
15.0000 mg | ORAL_TABLET | Freq: Every day | ORAL | 1 refills | Status: DC
Start: 2022-05-26 — End: 2022-11-08

## 2022-05-26 MED ORDER — ROSUVASTATIN CALCIUM 40 MG PO TABS: 40.0000 mg | ORAL_TABLET | Freq: Every day | ORAL | 3 refills | Status: DC

## 2022-05-26 MED ORDER — AMLODIPINE BESYLATE 2.5 MG PO TABS: 2.5000 mg | ORAL_TABLET | Freq: Every day | ORAL | 1 refills | Status: DC

## 2022-05-26 NOTE — Patient Instructions (Addendum)
Return in about 24 weeks (around 11/10/2022) for cpe (20 min), Routine chronic condition follow-up.        Great to see you today.  I have refilled the medication(s) we provide.   If labs were collected, we will inform you of lab results once received either by echart message or telephone call.   - echart message- for normal results that have been seen by the patient already.   - telephone call: abnormal results or if patient has not viewed results in their echart.
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
## 2022-06-24 ENCOUNTER — Other Ambulatory Visit: Payer: Self-pay | Admitting: Family Medicine

## 2022-07-02 ENCOUNTER — Other Ambulatory Visit: Payer: Self-pay | Admitting: Family Medicine

## 2022-07-07 ENCOUNTER — Telehealth: Payer: Self-pay | Admitting: *Deleted

## 2022-07-07 NOTE — Patient Outreach (Signed)
  Care Coordination   Initial Visit Note   07/07/2022 Name: Diana Spencer MRN: [id number] DOB: 02-19-1961  Diana Spencer is a 61 y.o. year old female who sees Spencer, Diana A, DO for primary care. I spoke with  Diana Spencer by phone today.  What matters to the patients health and wellness today?  No needs    Goals Addressed               This Visit's Progress     COMPLETED: No needs (pt-stated)        Care Coordination Interventions: Reviewed medications with patient and discussed Adherence with all prescribed medications Reviewed scheduled/upcoming provider appointments including pending appointments with sufficient transportation Screening for signs and symptoms of depression related to chronic disease state  Assessed social determinant of health barriers          SDOH assessments and interventions completed:  Yes  SDOH Interventions Today    Flowsheet Row Most Recent Value  SDOH Interventions   Food Insecurity Interventions Intervention Not Indicated  Housing Interventions Intervention Not Indicated  Transportation Interventions Intervention Not Indicated  Utilities Interventions Intervention Not Indicated        Care Coordination Interventions Activated:  Yes  Care Coordination Interventions:  Yes, provided   Follow up plan: No further intervention required.   Encounter Outcome:  Pt. Visit Completed   Diana Mina, RN Care Management Coordinator [place] Office [phone]

## 2022-07-07 NOTE — Patient Instructions (Signed)
Visit Information  Thank you for taking time to visit with me today. Please don't hesitate to contact me if I can be of assistance to you.   Following are the goals we discussed today:   Goals Addressed               This Visit's Progress     COMPLETED: No needs (pt-stated)        Care Coordination Interventions: Reviewed medications with patient and discussed Adherence with all prescribed medications Reviewed scheduled/upcoming provider appointments including pending appointments with sufficient transportation Screening for signs and symptoms of depression related to chronic disease state  Assessed social determinant of health barriers          Please call the care guide team at [phone] if you need to cancel or reschedule your appointment.   If you are experiencing a Mental Health or [place] or need someone to talk to, please call the Suicide and Crisis Lifeline: 988  Patient verbalizes understanding of instructions and care plan provided today and agrees to view in [place]. Active MyChart status and patient understanding of how to access instructions and care plan via MyChart confirmed with patient.     No further follow up required: No needs    Raina Mina, RN Care Management Coordinator [place] Office [phone]

## 2022-11-06 IMAGING — DX DG LUMBAR SPINE COMPLETE 4+V
5 series · 5 of 5 positions shown · non-contrast
Comparison: Lumbar spine x-ray 06/18/2020

CLINICAL DATA: Low back pain

EXAM:
LUMBAR SPINE - COMPLETE 4+ VIEW

[l-spine ap]
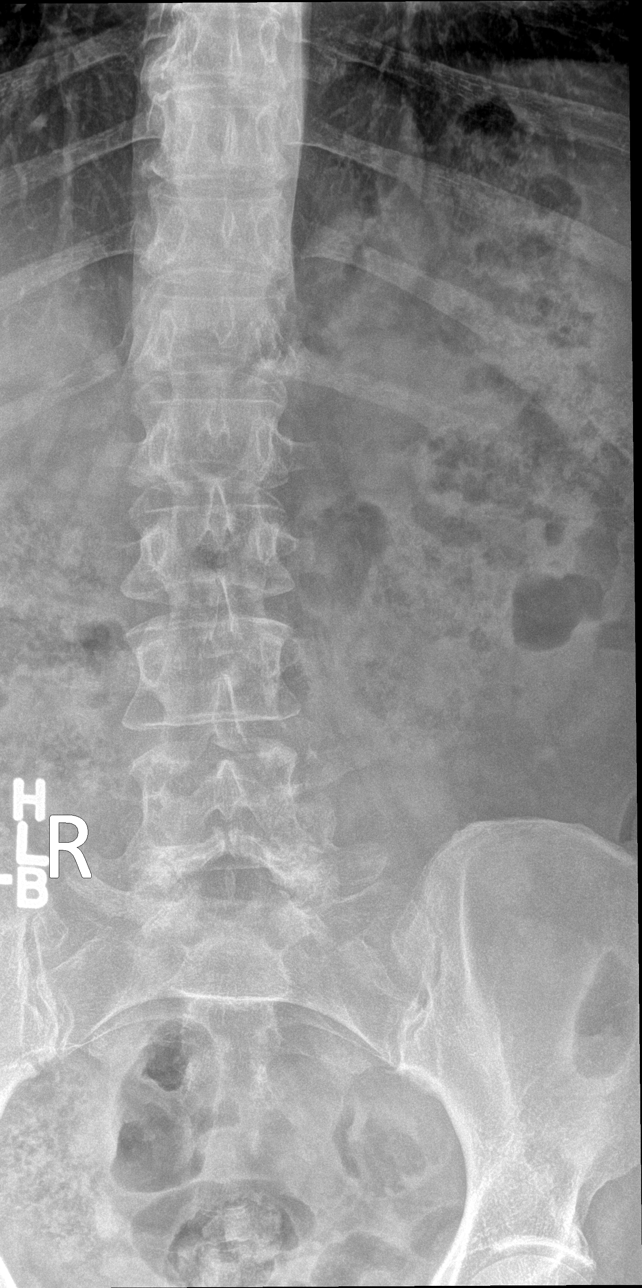

[l-spine obl (1 of 2)]
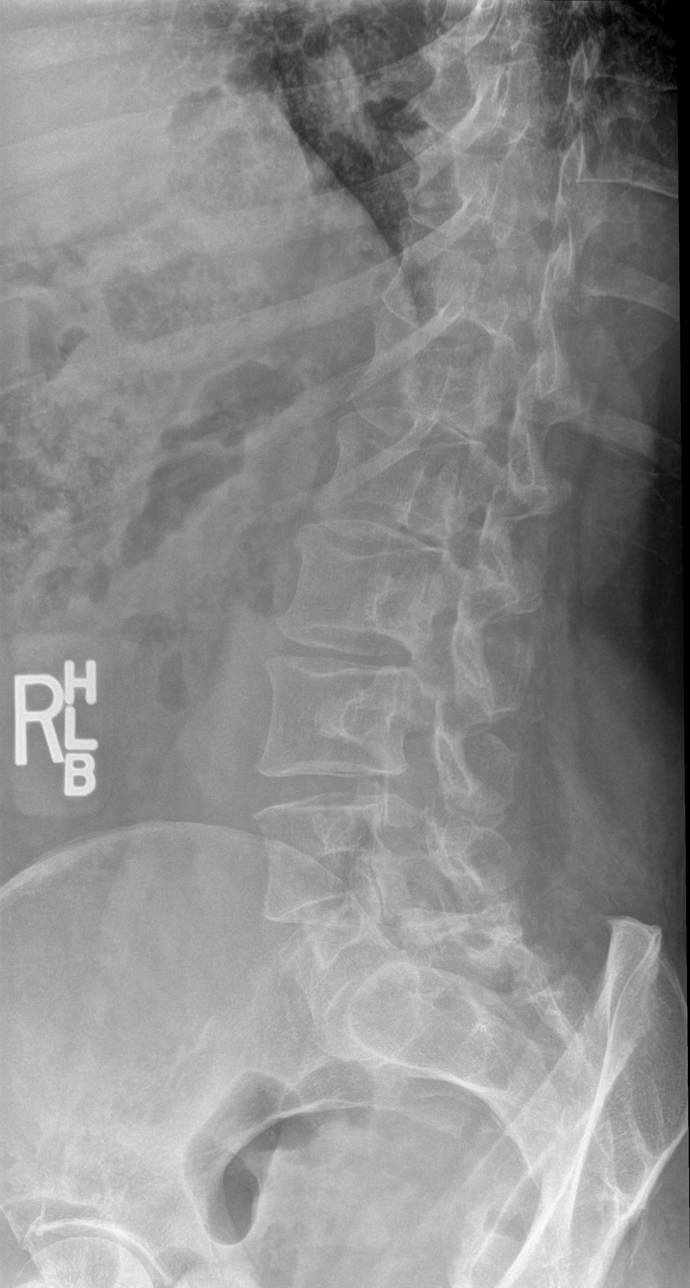

[l-spine obl (2 of 2)]
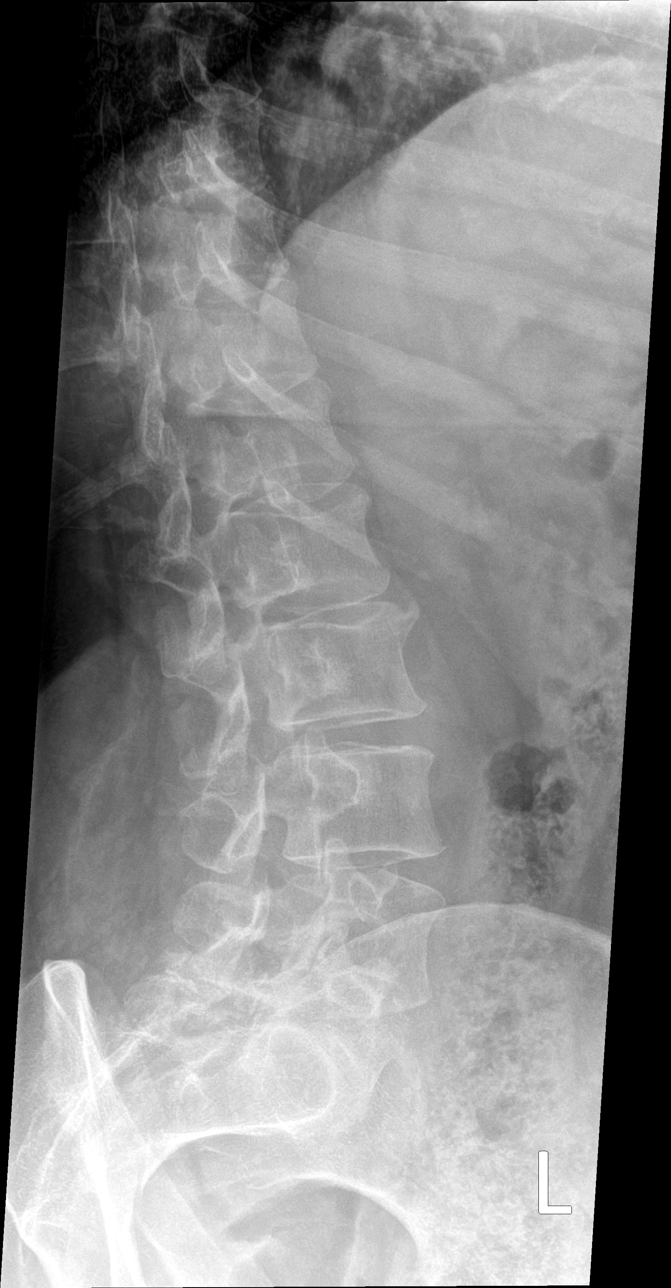

[l-spine lat]
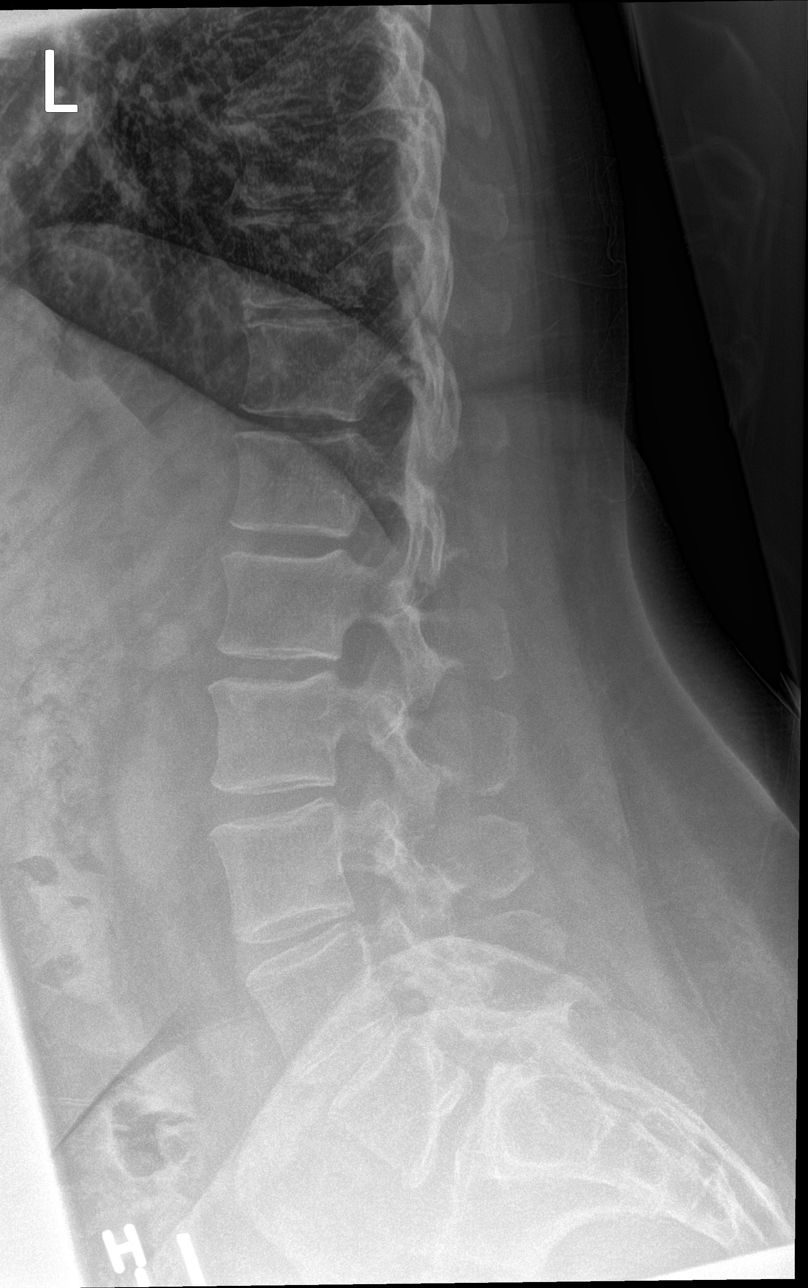

[l-spine spot]
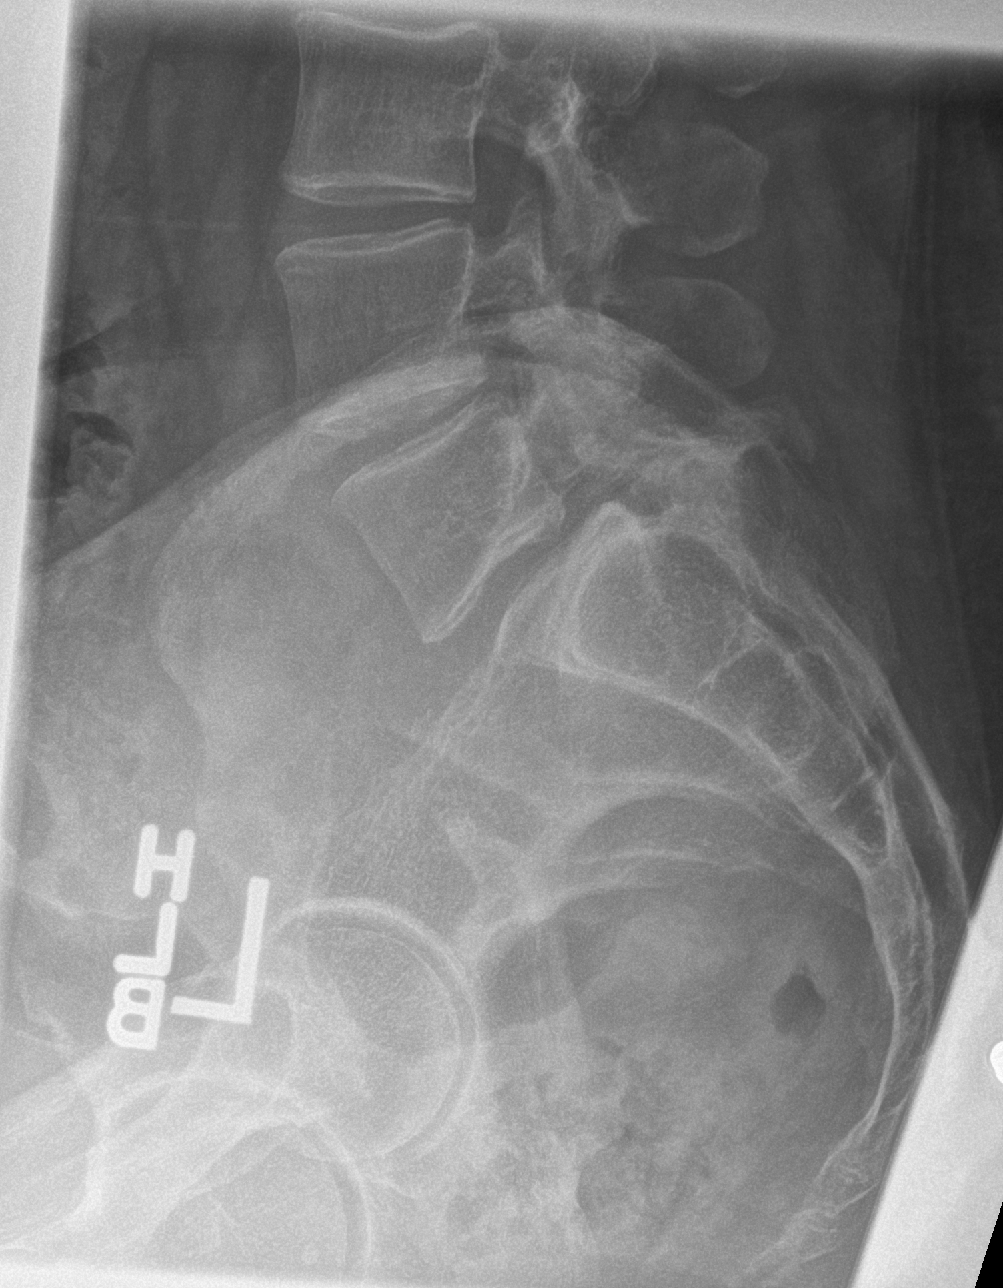

[5 of 5 positions shown; findings below may reference images not displayed]

FINDINGS: Lumbar vertebral body height are preserved without evidence of
fracture. Grade 1 anterolisthesis of L5 on S1. Appears to be
spondylolysis on the right at L5. Mild intervertebral disc space
narrowing throughout the lumbar spine. Facet arthropathy from L4-S1.
IMPRESSION: Degenerative changes of the lumbar spine as described.

## 2022-11-11 ENCOUNTER — Encounter: Payer: Self-pay | Admitting: Family Medicine

## 2022-11-11 ENCOUNTER — Ambulatory Visit (INDEPENDENT_AMBULATORY_CARE_PROVIDER_SITE_OTHER): Payer: Federal, State, Local not specified - PPO | Admitting: Family Medicine

## 2022-11-11 VITALS — BP 126/86 | HR 99 | Temp 98.0°F | Ht 61.61 in | Wt 203.4 lb

## 2022-11-11 DIAGNOSIS — Z13220 Encounter for screening for lipoid disorders: Secondary | ICD-10-CM

## 2022-11-11 DIAGNOSIS — Z131 Encounter for screening for diabetes mellitus: Secondary | ICD-10-CM | POA: Diagnosis not present

## 2022-11-11 DIAGNOSIS — M47816 Spondylosis without myelopathy or radiculopathy, lumbar region: Secondary | ICD-10-CM

## 2022-11-11 DIAGNOSIS — J4530 Mild persistent asthma, uncomplicated: Secondary | ICD-10-CM

## 2022-11-11 DIAGNOSIS — F330 Major depressive disorder, recurrent, mild: Secondary | ICD-10-CM

## 2022-11-11 DIAGNOSIS — E782 Mixed hyperlipidemia: Secondary | ICD-10-CM | POA: Diagnosis not present

## 2022-11-11 DIAGNOSIS — Z1231 Encounter for screening mammogram for malignant neoplasm of breast: Secondary | ICD-10-CM

## 2022-11-11 DIAGNOSIS — F419 Anxiety disorder, unspecified: Secondary | ICD-10-CM

## 2022-11-11 DIAGNOSIS — Z0000 Encounter for general adult medical examination without abnormal findings: Secondary | ICD-10-CM | POA: Diagnosis not present

## 2022-11-11 DIAGNOSIS — K219 Gastro-esophageal reflux disease without esophagitis: Secondary | ICD-10-CM

## 2022-11-11 DIAGNOSIS — I10 Essential (primary) hypertension: Secondary | ICD-10-CM

## 2022-11-11 DIAGNOSIS — Z130 Encounter for screening for diseases of the blood and blood-forming organs and certain disorders involving the immune mechanism: Secondary | ICD-10-CM

## 2022-11-11 DIAGNOSIS — M545 Low back pain, unspecified: Secondary | ICD-10-CM

## 2022-11-11 LAB — COMPREHENSIVE METABOLIC PANEL
ALT: 26 U/L (ref 0–35)
AST: 30 U/L (ref 0–37)
Albumin: 4.3 g/dL (ref 3.5–5.2)
Alkaline Phosphatase: 103 U/L (ref 39–117)
BUN: 15 mg/dL (ref 6–23)
CO2: 27 mEq/L (ref 19–32)
Calcium: 9.5 mg/dL (ref 8.4–10.5)
Chloride: 103 mEq/L (ref 96–112)
Creatinine, Ser: 0.96 mg/dL (ref 0.40–1.20)
GFR: 64.74 mL/min (ref >60.00)
Glucose, Bld: 90 mg/dL (ref 70–99)
Potassium: 4.1 mEq/L (ref 3.5–5.1)
Sodium: 141 mEq/L (ref 135–145)
Total Bilirubin: 1.4 mg/dL — ABNORMAL HIGH (ref 0.2–1.2)
Total Protein: 6.8 g/dL (ref 6.0–8.3)

## 2022-11-11 LAB — CBC WITH DIFFERENTIAL/PLATELET
Basophils Absolute: 0.1 10*3/uL (ref 0.0–0.1)
Basophils Relative: 1.1 % (ref 0.0–3.0)
Eosinophils Absolute: 0.2 10*3/uL (ref 0.0–0.7)
Eosinophils Relative: 3.4 % (ref 0.0–5.0)
HCT: 43.8 % (ref 36.0–46.0)
Hemoglobin: 14.6 g/dL (ref 12.0–15.0)
Lymphocytes Relative: 17.7 % (ref 12.0–46.0)
Lymphs Abs: 1.1 10*3/uL (ref 0.7–4.0)
MCHC: 33.3 g/dL (ref 30.0–36.0)
MCV: 98.4 fl (ref 78.0–100.0)
Monocytes Absolute: 0.4 10*3/uL (ref 0.1–1.0)
Monocytes Relative: 6.6 % (ref 3.0–12.0)
Neutro Abs: 4.5 10*3/uL (ref 1.4–7.7)
Neutrophils Relative %: 71.2 % (ref 43.0–77.0)
Platelets: 257 10*3/uL (ref 150.0–400.0)
RBC: 4.45 Mil/uL (ref 3.87–5.11)
RDW: 14.4 % (ref 11.5–15.5)
WBC: 6.3 10*3/uL (ref 4.0–10.5)

## 2022-11-11 LAB — LIPID PANEL
Cholesterol: 150 mg/dL (ref 0–200)
HDL: 65.4 mg/dL (ref >39.00)
NonHDL: 84.95
Total CHOL/HDL Ratio: 2
Triglycerides: 201 mg/dL — ABNORMAL HIGH (ref 0.0–149.0)
VLDL: 40.2 mg/dL — ABNORMAL HIGH (ref 0.0–40.0)

## 2022-11-11 LAB — LDL CHOLESTEROL, DIRECT: Direct LDL: 65 mg/dL

## 2022-11-11 LAB — TSH: TSH: 1.96 u[IU]/mL (ref 0.35–5.50)

## 2022-11-11 LAB — HEMOGLOBIN A1C: Hgb A1c MFr Bld: 5.2 % (ref 4.6–6.5)

## 2022-11-11 MED ORDER — ONDANSETRON HCL 4 MG PO TABS: 4.0000 mg | ORAL_TABLET | Freq: Three times a day (TID) | ORAL | 5 refills | Status: DC | PRN

## 2022-11-11 MED ORDER — DIAZEPAM 10 MG PO TABS: 5.0000 mg | ORAL_TABLET | Freq: Every day | ORAL | 5 refills | Status: DC | PRN

## 2022-11-11 MED ORDER — VENLAFAXINE HCL ER 75 MG PO CP24: 225.0000 mg | ORAL_CAPSULE | Freq: Every day | ORAL | 1 refills | Status: DC

## 2022-11-11 MED ORDER — DICYCLOMINE HCL 10 MG PO CAPS
10.0000 mg | ORAL_CAPSULE | Freq: Three times a day (TID) | ORAL | 1 refills | Status: DC
Start: 2022-11-11 — End: 2023-05-13

## 2022-11-11 MED ORDER — ROSUVASTATIN CALCIUM 40 MG PO TABS: 40.0000 mg | ORAL_TABLET | Freq: Every day | ORAL | 3 refills | Status: DC

## 2022-11-11 MED ORDER — ESOMEPRAZOLE MAGNESIUM 20 MG PO CPDR: 20.0000 mg | DELAYED_RELEASE_CAPSULE | Freq: Two times a day (BID) | ORAL | 1 refills | Status: DC

## 2022-11-11 MED ORDER — MELOXICAM 15 MG PO TABS: 15.0000 mg | ORAL_TABLET | Freq: Every day | ORAL | 1 refills | Status: DC

## 2022-11-11 MED ORDER — BUDESONIDE-FORMOTEROL FUMARATE 160-4.5 MCG/ACT IN AERO: 2.0000 | INHALATION_SPRAY | Freq: Two times a day (BID) | RESPIRATORY_TRACT | 11 refills | Status: DC

## 2022-11-11 MED ORDER — AMLODIPINE BESYLATE 2.5 MG PO TABS: 2.5000 mg | ORAL_TABLET | Freq: Every day | ORAL | 1 refills | Status: DC

## 2022-11-11 MED ORDER — ALBUTEROL SULFATE HFA 108 (90 BASE) MCG/ACT IN AERS: 1.0000 | INHALATION_SPRAY | Freq: Four times a day (QID) | RESPIRATORY_TRACT | 11 refills | Status: DC | PRN

## 2022-11-11 NOTE — Patient Instructions (Addendum)
Return for Routine chronic condition follow-up.        Great to see you today.  I have refilled the medication(s) we provide.   If labs were collected, we will inform you of lab results once received either by echart message or telephone call.   - echart message- for normal results that have been seen by the patient already.   - telephone call: abnormal results or if patient has not viewed results in their echart.
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
## 2022-12-27 ENCOUNTER — Encounter (HOSPITAL_BASED_OUTPATIENT_CLINIC_OR_DEPARTMENT_OTHER): Payer: Self-pay

## 2022-12-27 ENCOUNTER — Ambulatory Visit (HOSPITAL_BASED_OUTPATIENT_CLINIC_OR_DEPARTMENT_OTHER)
Admission: RE | Admit: 2022-12-27 | Discharge: 2022-12-27 | Disposition: A | Discharge status: Home / Self Care | Payer: Federal, State, Local not specified - PPO | Source: Ambulatory Visit | Attending: Family Medicine | Admitting: Family Medicine

## 2022-12-27 DIAGNOSIS — Z1231 Encounter for screening mammogram for malignant neoplasm of breast: Secondary | ICD-10-CM | POA: Diagnosis not present

## 2023-01-05 ENCOUNTER — Telehealth: Payer: Self-pay

## 2023-01-05 NOTE — Telephone Encounter (Signed)
Demetrica Torosyan (Key: [id number]) PA Case ID #: [id number] Need Help? Call us at [phone] Outcome: Approved today Your PA request has been approved. Additional information will be provided in the approval communication. (Message 1145) Authorization Expiration Date: 01/05/2024

## 2023-04-01 ENCOUNTER — Other Ambulatory Visit: Payer: Self-pay

## 2023-04-05 ENCOUNTER — Other Ambulatory Visit: Payer: Self-pay

## 2023-04-05 MED ORDER — AMLODIPINE BESYLATE 2.5 MG PO TABS: 2.5000 mg | ORAL_TABLET | Freq: Every day | ORAL | 0 refills | Status: DC

## 2023-04-14 ENCOUNTER — Encounter (INDEPENDENT_AMBULATORY_CARE_PROVIDER_SITE_OTHER): Payer: Self-pay

## 2023-05-07 ENCOUNTER — Other Ambulatory Visit: Payer: Self-pay | Admitting: Family Medicine

## 2023-05-16 ENCOUNTER — Ambulatory Visit: Payer: Federal, State, Local not specified - PPO | Admitting: Family Medicine

## 2023-05-16 ENCOUNTER — Encounter: Payer: Self-pay | Admitting: Family Medicine

## 2023-05-16 VITALS — BP 131/89 | HR 85 | Temp 97.6°F | Wt 207.4 lb

## 2023-05-16 DIAGNOSIS — F330 Major depressive disorder, recurrent, mild: Secondary | ICD-10-CM

## 2023-05-16 DIAGNOSIS — K219 Gastro-esophageal reflux disease without esophagitis: Secondary | ICD-10-CM

## 2023-05-16 DIAGNOSIS — M4316 Spondylolisthesis, lumbar region: Secondary | ICD-10-CM

## 2023-05-16 DIAGNOSIS — M545 Low back pain, unspecified: Secondary | ICD-10-CM

## 2023-05-16 DIAGNOSIS — I10 Essential (primary) hypertension: Secondary | ICD-10-CM

## 2023-05-16 DIAGNOSIS — F419 Anxiety disorder, unspecified: Secondary | ICD-10-CM

## 2023-05-16 DIAGNOSIS — E782 Mixed hyperlipidemia: Secondary | ICD-10-CM | POA: Diagnosis not present

## 2023-05-16 DIAGNOSIS — M47816 Spondylosis without myelopathy or radiculopathy, lumbar region: Secondary | ICD-10-CM

## 2023-05-16 DIAGNOSIS — Z23 Encounter for immunization: Secondary | ICD-10-CM

## 2023-05-16 DIAGNOSIS — J4530 Mild persistent asthma, uncomplicated: Secondary | ICD-10-CM

## 2023-05-16 MED ORDER — ROSUVASTATIN CALCIUM 40 MG PO TABS: 40.0000 mg | ORAL_TABLET | Freq: Every day | ORAL | 3 refills | Status: DC

## 2023-05-16 MED ORDER — FAMOTIDINE 20 MG PO TABS: 20.0000 mg | ORAL_TABLET | Freq: Two times a day (BID) | ORAL | 1 refills | Status: DC

## 2023-05-16 MED ORDER — DIAZEPAM 10 MG PO TABS: 5.0000 mg | ORAL_TABLET | Freq: Every day | ORAL | 5 refills | Status: DC | PRN

## 2023-05-16 MED ORDER — MELOXICAM 15 MG PO TABS: 15.0000 mg | ORAL_TABLET | Freq: Every day | ORAL | 1 refills | Status: DC

## 2023-05-16 MED ORDER — ESOMEPRAZOLE MAGNESIUM 20 MG PO CPDR: 20.0000 mg | DELAYED_RELEASE_CAPSULE | Freq: Two times a day (BID) | ORAL | 1 refills | Status: DC

## 2023-05-16 MED ORDER — VENLAFAXINE HCL ER 75 MG PO CP24: 225.0000 mg | ORAL_CAPSULE | Freq: Every day | ORAL | 1 refills | Status: DC

## 2023-05-16 MED ORDER — ONDANSETRON HCL 4 MG PO TABS: 4.0000 mg | ORAL_TABLET | Freq: Three times a day (TID) | ORAL | 5 refills | Status: DC | PRN

## 2023-05-16 MED ORDER — DICYCLOMINE HCL 10 MG PO CAPS: 10.0000 mg | ORAL_CAPSULE | Freq: Three times a day (TID) | ORAL | 1 refills | Status: DC

## 2023-05-16 MED ORDER — AMLODIPINE BESYLATE 5 MG PO TABS: 5.0000 mg | ORAL_TABLET | Freq: Every day | ORAL | 1 refills | Status: DC
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
## 2023-05-16 NOTE — Patient Instructions (Addendum)
Return in about 26 weeks (around 11/14/2023) for cpe (20 min), Routine chronic condition follow-up.        Great to see you today.  I have refilled the medication(s) we provide.   If labs were collected or images ordered, we will inform you of  results once we have received them and reviewed. We will contact you either by echart message, or telephone call.  Please give ample time to the testing facility, and our office to run,  receive and review results. Please do not call inquiring of results, even if you can see them in your chart. We will contact you as soon as we are able. If it has been over 1 week since the test was completed, and you have not yet heard from [place], then please call us.    - echart message- for normal results that have been seen by the patient already.   - telephone call: abnormal results or if patient has not viewed results in their echart.  If a referral to a specialist was entered for you, please call us in 2 weeks if you have not heard from the specialist office to schedule.

## 2023-09-20 ENCOUNTER — Ambulatory Visit: Payer: Federal, State, Local not specified - PPO | Admitting: Family Medicine

## 2023-09-20 VITALS — BP 108/70 | HR 78 | Temp 97.8°F | Wt 209.6 lb

## 2023-09-20 DIAGNOSIS — J329 Chronic sinusitis, unspecified: Secondary | ICD-10-CM | POA: Diagnosis not present

## 2023-09-20 DIAGNOSIS — B9689 Other specified bacterial agents as the cause of diseases classified elsewhere: Secondary | ICD-10-CM | POA: Diagnosis not present

## 2023-09-20 DIAGNOSIS — H6690 Otitis media, unspecified, unspecified ear: Secondary | ICD-10-CM | POA: Diagnosis not present

## 2023-09-20 DIAGNOSIS — R051 Acute cough: Secondary | ICD-10-CM

## 2023-09-20 LAB — POC COVID19 BINAXNOW: SARS Coronavirus 2 Ag: NEGATIVE

## 2023-09-20 LAB — POC INFLUENZA A&B (BINAX/QUICKVUE)
Influenza A, POC: NEGATIVE
Influenza B, POC: NEGATIVE

## 2023-09-20 MED ORDER — CEFDINIR 300 MG PO CAPS: 300.0000 mg | ORAL_CAPSULE | Freq: Two times a day (BID) | ORAL | 0 refills | Status: DC

## 2023-09-20 NOTE — Patient Instructions (Addendum)
No follow-ups on file.   OTC:  flonase, mucinex (DM if cough), nettie pot or nasal saline.  Called in [place], every 12 hours for 10 days       Great to see you today.  I have refilled the medication(s) we provide.   If labs were collected or images ordered, we will inform you of  results once we have received them and reviewed. We will contact you either by echart message, or telephone call.  Please give ample time to the testing facility, and our office to run,  receive and review results. Please do not call inquiring of results, even if you can see them in your chart. We will contact you as soon as we are able. If it has been over 1 week since the test was completed, and you have not yet heard from [place], then please call us.    - echart message- for normal results that have been seen by the patient already.   - telephone call: abnormal results or if patient has not viewed results in their echart.  If a referral to a specialist was entered for you, please call us in 2 weeks if you have not heard from the specialist office to schedule.  '
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
## 2023-10-14 ENCOUNTER — Encounter: Payer: Self-pay | Admitting: Family Medicine

## 2023-10-14 ENCOUNTER — Ambulatory Visit: Payer: Federal, State, Local not specified - PPO | Admitting: Family Medicine

## 2023-10-14 VITALS — BP 132/76 | HR 80 | Temp 98.0°F | Wt 201.0 lb

## 2023-10-14 DIAGNOSIS — H6591 Unspecified nonsuppurative otitis media, right ear: Secondary | ICD-10-CM

## 2023-10-14 MED ORDER — CEFDINIR 300 MG PO CAPS: 300.0000 mg | ORAL_CAPSULE | Freq: Two times a day (BID) | ORAL | 0 refills | Status: DC
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
## 2023-11-15 ENCOUNTER — Encounter: Payer: Federal, State, Local not specified - PPO | Admitting: Family Medicine

## 2023-11-18 ENCOUNTER — Telehealth: Payer: Self-pay

## 2023-11-18 DIAGNOSIS — H6591 Unspecified nonsuppurative otitis media, right ear: Secondary | ICD-10-CM

## 2023-11-18 NOTE — Telephone Encounter (Signed)
 Yes, ENT is fine, however if there is a long wait period to get in, she may want to be seen with [place] in the interim. thanks

## 2023-11-18 NOTE — Telephone Encounter (Signed)
 Copied from CRM [id number]. Topic: General - Other >> Nov 18, 2023 10:15 AM Aletta Edouard wrote: Reason for CRM: patient is  needing a referral to a ent dr she is still having issues with her right ear   Pt was seen on 10/14/23 regarding concern. Please advise if ok for ENT referral or re-evaluation needed

## 2023-11-21 NOTE — Addendum Note (Signed)
 Addended by: Emi Holes D on: 11/21/2023 10:06 AM   Modules accepted: Orders

## 2023-12-03 ENCOUNTER — Other Ambulatory Visit: Payer: Self-pay | Admitting: Family Medicine

## 2023-12-09 ENCOUNTER — Encounter (INDEPENDENT_AMBULATORY_CARE_PROVIDER_SITE_OTHER): Payer: Self-pay | Admitting: Otolaryngology

## 2023-12-26 ENCOUNTER — Other Ambulatory Visit (HOSPITAL_COMMUNITY): Payer: Self-pay

## 2023-12-26 ENCOUNTER — Telehealth: Payer: Self-pay

## 2023-12-26 NOTE — Telephone Encounter (Signed)
 Pharmacy Patient Advocate Encounter  Received notification from [facility] that Prior Authorization for Esomeprazole  Magnesium  20MG  dr capsules has been APPROVED from 11/26/2023 to 12/25/2024   PA #/Case ID/Reference #: [id number]

## 2023-12-26 NOTE — Telephone Encounter (Signed)
 Pharmacy Patient Advocate Encounter   Received notification from CoverMyMeds that prior authorization for Esomeprazole  Magnesium  20MG  dr capsules is due for renewal.   Insurance verification completed.   The patient is insured through [facility].  Action: PA required; PA submitted to above mentioned insurance via CoverMyMeds Key/confirmation #/EOC [employer] Status is pending

## 2023-12-30 ENCOUNTER — Ambulatory Visit (INDEPENDENT_AMBULATORY_CARE_PROVIDER_SITE_OTHER): Admitting: Family Medicine

## 2023-12-30 ENCOUNTER — Encounter: Payer: Self-pay | Admitting: Family Medicine

## 2023-12-30 VITALS — BP 112/84 | HR 78 | Temp 98.3°F | Ht 61.0 in | Wt 198.8 lb

## 2023-12-30 DIAGNOSIS — Z131 Encounter for screening for diabetes mellitus: Secondary | ICD-10-CM | POA: Diagnosis not present

## 2023-12-30 DIAGNOSIS — E782 Mixed hyperlipidemia: Secondary | ICD-10-CM | POA: Diagnosis not present

## 2023-12-30 DIAGNOSIS — E2839 Other primary ovarian failure: Secondary | ICD-10-CM | POA: Diagnosis not present

## 2023-12-30 DIAGNOSIS — F330 Major depressive disorder, recurrent, mild: Secondary | ICD-10-CM

## 2023-12-30 DIAGNOSIS — Z0000 Encounter for general adult medical examination without abnormal findings: Secondary | ICD-10-CM

## 2023-12-30 DIAGNOSIS — K219 Gastro-esophageal reflux disease without esophagitis: Secondary | ICD-10-CM

## 2023-12-30 DIAGNOSIS — F419 Anxiety disorder, unspecified: Secondary | ICD-10-CM

## 2023-12-30 DIAGNOSIS — I10 Essential (primary) hypertension: Secondary | ICD-10-CM | POA: Diagnosis not present

## 2023-12-30 DIAGNOSIS — Z1231 Encounter for screening mammogram for malignant neoplasm of breast: Secondary | ICD-10-CM

## 2023-12-30 LAB — COMPREHENSIVE METABOLIC PANEL WITH GFR
ALT: 24 U/L (ref 0–35)
AST: 25 U/L (ref 0–37)
Albumin: 4.7 g/dL (ref 3.5–5.2)
Alkaline Phosphatase: 90 U/L (ref 39–117)
BUN: 17 mg/dL (ref 6–23)
CO2: 29 meq/L (ref 19–32)
Calcium: 10.1 mg/dL (ref 8.4–10.5)
Chloride: 105 meq/L (ref 96–112)
Creatinine, Ser: 0.93 mg/dL (ref 0.40–1.20)
GFR: 66.72 mL/min (ref >60.00)
Glucose, Bld: 105 mg/dL — ABNORMAL HIGH (ref 70–99)
Potassium: 4.7 meq/L (ref 3.5–5.1)
Sodium: 143 meq/L (ref 135–145)
Total Bilirubin: 1.6 mg/dL — ABNORMAL HIGH (ref 0.2–1.2)
Total Protein: 6.8 g/dL (ref 6.0–8.3)

## 2023-12-30 LAB — LIPID PANEL
Cholesterol: 156 mg/dL (ref 0–200)
HDL: 58.9 mg/dL (ref >39.00)
LDL Cholesterol: 71 mg/dL (ref 0–99)
NonHDL: 96.61
Total CHOL/HDL Ratio: 3
Triglycerides: 129 mg/dL (ref 0.0–149.0)
VLDL: 25.8 mg/dL (ref 0.0–40.0)

## 2023-12-30 LAB — HEMOGLOBIN A1C: Hgb A1c MFr Bld: 5.1 % (ref 4.6–6.5)

## 2023-12-30 LAB — TSH: TSH: 2.46 u[IU]/mL (ref 0.35–5.50)

## 2023-12-30 MED ORDER — ESOMEPRAZOLE MAGNESIUM 20 MG PO CPDR: 20.0000 mg | DELAYED_RELEASE_CAPSULE | Freq: Two times a day (BID) | ORAL | 1 refills | Status: DC

## 2023-12-30 MED ORDER — MELOXICAM 15 MG PO TABS: 15.0000 mg | ORAL_TABLET | Freq: Every day | ORAL | 1 refills | Status: DC

## 2023-12-30 MED ORDER — DICYCLOMINE HCL 10 MG PO CAPS: 10.0000 mg | ORAL_CAPSULE | Freq: Three times a day (TID) | ORAL | 1 refills | Status: DC

## 2023-12-30 MED ORDER — DIAZEPAM 10 MG PO TABS: 5.0000 mg | ORAL_TABLET | Freq: Every day | ORAL | 5 refills | Status: DC | PRN

## 2023-12-30 MED ORDER — VENLAFAXINE HCL ER 75 MG PO CP24: 225.0000 mg | ORAL_CAPSULE | Freq: Every day | ORAL | 1 refills | Status: DC

## 2023-12-30 MED ORDER — AMLODIPINE BESYLATE 5 MG PO TABS: 5.0000 mg | ORAL_TABLET | Freq: Every day | ORAL | 1 refills | Status: DC

## 2023-12-30 NOTE — Patient Instructions (Addendum)
 Return in about 25 weeks (around 06/22/2024) for Routine chronic condition follow-up.        Great to see you today.  I have refilled the medication(s) we provide.   If labs were collected or images ordered, we will inform you of  results once we have received them and reviewed. We will contact you either by echart message, or telephone call.  Please give ample time to the testing facility, and our office to run,  receive and review results. Please do not call inquiring of results, even if you can see them in your chart. We will contact you as soon as we are able. If it has been over 1 week since the test was completed, and you have not yet heard from us , then please call us .    - echart message- for normal results that have been seen by the patient already.   - telephone call: abnormal results or if patient has not viewed results in their echart.  If a referral to a specialist was entered for you, please call us  in 2 weeks if you have not heard from the specialist office to schedule.
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
## 2024-01-01 LAB — CBC
HCT: 43.8 % (ref 36.0–46.0)
Hemoglobin: 14.5 g/dL (ref 12.0–15.0)
MCHC: 33.1 g/dL (ref 30.0–36.0)
MCV: 99.2 fl (ref 78.0–100.0)
Platelets: 230 10*3/uL (ref 150.0–400.0)
RBC: 4.41 Mil/uL (ref 3.87–5.11)
RDW: 15.1 % (ref 11.5–15.5)
WBC: 6.1 10*3/uL (ref 4.0–10.5)

## 2024-01-02 ENCOUNTER — Telehealth: Payer: Self-pay | Admitting: Family Medicine

## 2024-01-02 DIAGNOSIS — R17 Unspecified jaundice: Secondary | ICD-10-CM | POA: Insufficient documentation

## 2024-01-02 NOTE — Telephone Encounter (Signed)
 Pt aware and verbalized understanding.

## 2024-01-02 NOTE — Telephone Encounter (Signed)
 Please call patient Patient's bilirubin has elevated again.  Last year it was just very mildly elevated.  This year it is elevated just a bit more.  This is new for her and needs to be further evaluated on potential cause.  I have referred her back to her gastroenterologist Dr. Nandigam to get them to evaluate.  Liver function, kidney function, thyroid  function are normal. Blood cell counts and electrolytes are normal. Cholesterol levels look great and are at goal. A1c is normal

## 2024-01-04 ENCOUNTER — Encounter: Payer: Self-pay | Admitting: Gastroenterology

## 2024-01-26 ENCOUNTER — Ambulatory Visit: Admitting: Gastroenterology

## 2024-01-26 ENCOUNTER — Other Ambulatory Visit (INDEPENDENT_AMBULATORY_CARE_PROVIDER_SITE_OTHER)

## 2024-01-26 ENCOUNTER — Encounter: Payer: Self-pay | Admitting: Gastroenterology

## 2024-01-26 VITALS — BP 122/80 | HR 92 | Ht 62.0 in | Wt 197.1 lb

## 2024-01-26 DIAGNOSIS — R17 Unspecified jaundice: Secondary | ICD-10-CM

## 2024-01-26 DIAGNOSIS — Z9889 Other specified postprocedural states: Secondary | ICD-10-CM

## 2024-01-26 DIAGNOSIS — K219 Gastro-esophageal reflux disease without esophagitis: Secondary | ICD-10-CM

## 2024-01-26 DIAGNOSIS — R110 Nausea: Secondary | ICD-10-CM | POA: Diagnosis not present

## 2024-01-26 DIAGNOSIS — R198 Other specified symptoms and signs involving the digestive system and abdomen: Secondary | ICD-10-CM

## 2024-01-26 DIAGNOSIS — R197 Diarrhea, unspecified: Secondary | ICD-10-CM

## 2024-01-26 DIAGNOSIS — R1084 Generalized abdominal pain: Secondary | ICD-10-CM

## 2024-01-26 DIAGNOSIS — R109 Unspecified abdominal pain: Secondary | ICD-10-CM

## 2024-01-26 DIAGNOSIS — K5900 Constipation, unspecified: Secondary | ICD-10-CM

## 2024-01-26 LAB — COMPREHENSIVE METABOLIC PANEL WITH GFR
ALT: 24 U/L (ref 0–35)
AST: 29 U/L (ref 0–37)
Albumin: 4.8 g/dL (ref 3.5–5.2)
Alkaline Phosphatase: 93 U/L (ref 39–117)
BUN: 15 mg/dL (ref 6–23)
CO2: 29 meq/L (ref 19–32)
Calcium: 10 mg/dL (ref 8.4–10.5)
Chloride: 104 meq/L (ref 96–112)
Creatinine, Ser: 0.9 mg/dL (ref 0.40–1.20)
GFR: 69.37 mL/min (ref >60.00)
Glucose, Bld: 97 mg/dL (ref 70–99)
Potassium: 4.5 meq/L (ref 3.5–5.1)
Sodium: 142 meq/L (ref 135–145)
Total Bilirubin: 1.3 mg/dL — ABNORMAL HIGH (ref 0.2–1.2)
Total Protein: 7.4 g/dL (ref 6.0–8.3)

## 2024-01-26 LAB — CBC WITH DIFFERENTIAL/PLATELET
Basophils Absolute: 0 10*3/uL (ref 0.0–0.1)
Basophils Relative: 0.7 % (ref 0.0–3.0)
Eosinophils Absolute: 0.3 10*3/uL (ref 0.0–0.7)
Eosinophils Relative: 4.3 % (ref 0.0–5.0)
HCT: 44 % (ref 36.0–46.0)
Hemoglobin: 15 g/dL (ref 12.0–15.0)
Lymphocytes Relative: 16.5 % (ref 12.0–46.0)
Lymphs Abs: 1 10*3/uL (ref 0.7–4.0)
MCHC: 34 g/dL (ref 30.0–36.0)
MCV: 94.2 fl (ref 78.0–100.0)
Monocytes Absolute: 0.5 10*3/uL (ref 0.1–1.0)
Monocytes Relative: 7.7 % (ref 3.0–12.0)
Neutro Abs: 4.5 10*3/uL (ref 1.4–7.7)
Neutrophils Relative %: 70.8 % (ref 43.0–77.0)
Platelets: 242 10*3/uL (ref 150.0–400.0)
RBC: 4.68 Mil/uL (ref 3.87–5.11)
RDW: 15.1 % (ref 11.5–15.5)
WBC: 6.3 10*3/uL (ref 4.0–10.5)

## 2024-01-26 LAB — PROTIME-INR
INR: 1 ratio (ref 0.8–1.0)
Prothrombin Time: 11.1 s (ref 9.6–13.1)

## 2024-01-26 MED ORDER — IBGARD 90 MG PO CPCR: ORAL_CAPSULE | ORAL | Status: DC
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
## 2024-01-26 NOTE — Patient Instructions (Addendum)
 Your provider has requested that you go to the basement level for lab work before leaving today. Press "B" on the elevator. The lab is located at the first door on the left as you exit the elevator.  Due to recent changes in healthcare laws, you may see the results of your imaging and laboratory studies on MyChart before your provider has had a chance to review them.  We understand that in some cases there may be results that are confusing or concerning to you. Not all laboratory results come back in the same time frame and the provider may be waiting for multiple results in order to interpret others.  Please give us  48 hours in order for your provider to thoroughly review all the results before contacting the office for clarification of your results.    A high fiber diet with plenty of fluids (up to 8 glasses of water daily) is suggested to relieve these symptoms.  Recommended fiber supplement, such as Metamucil, Benefiber, or Citucel, 1 tablespoon once or twice daily can be used to keep bowels regular if needed.   Continue Bentyl  for abdominal pain.  We have given you samples of the following medication to take: IBgard, take as directed.  You have been scheduled for an endoscopy. Please follow written instructions given to you at your visit today.  If you use inhalers (even only as needed), please bring them with you on the day of your procedure.  If you take any of the following medications, they will need to be adjusted prior to your procedure:   DO NOT TAKE 7 DAYS PRIOR TO TEST- Trulicity (dulaglutide) Ozempic, Wegovy (semaglutide) Mounjaro (tirzepatide) Bydureon Bcise (exanatide extended release)  DO NOT TAKE 1 DAY PRIOR TO YOUR TEST Rybelsus (semaglutide) Adlyxin (lixisenatide) Victoza (liraglutide) Byetta (exanatide) ___________________________________________________________________________  Follow up in 3 months.   Thank you for trusting me with your gastrointestinal  care!   Valiant Gaul, PA-C   _______________________________________________________  If your blood pressure at your visit was 140/90 or greater, please contact your primary care physician to follow up on this.  _______________________________________________________  If you are age 61 or older, your body mass index should be between 23-30. Your Body mass index is 36.05 kg/m. If this is out of the aforementioned range listed, please consider follow up with your Primary Care Provider.  If you are age 61 or younger, your body mass index should be between 19-25. Your Body mass index is 36.05 kg/m. If this is out of the aformentioned range listed, please consider follow up with your Primary Care Provider.   ________________________________________________________  The [facility] providers would like to encourage you to use MYCHART to communicate with providers for non-urgent requests or questions.  Due to long hold times on the telephone, sending your provider a message by [facility] may be a faster and more efficient way to get a response.  Please allow 48 business hours for a response.  Please remember that this is for non-urgent requests.  _______________________________________________________

## 2024-01-27 ENCOUNTER — Ambulatory Visit: Payer: Self-pay | Admitting: Gastroenterology

## 2024-01-27 DIAGNOSIS — R17 Unspecified jaundice: Secondary | ICD-10-CM

## 2024-01-27 LAB — BILIRUBIN, FRACTIONATED(TOT/DIR/INDIR)
Bilirubin, Direct: 0.2 mg/dL (ref 0.0–0.2)
Indirect Bilirubin: 0.8 mg/dL (ref 0.2–1.2)
Total Bilirubin: 1 mg/dL (ref 0.2–1.2)

## 2024-02-08 ENCOUNTER — Inpatient Hospital Stay (HOSPITAL_BASED_OUTPATIENT_CLINIC_OR_DEPARTMENT_OTHER): Admission: RE | Admit: 2024-02-08 | Source: Ambulatory Visit

## 2024-02-13 ENCOUNTER — Ambulatory Visit (HOSPITAL_BASED_OUTPATIENT_CLINIC_OR_DEPARTMENT_OTHER)
Admission: RE | Admit: 2024-02-13 | Discharge: 2024-02-13 | Disposition: A | Discharge status: Home / Self Care | Source: Ambulatory Visit | Attending: Family Medicine | Admitting: Family Medicine

## 2024-02-13 ENCOUNTER — Encounter (HOSPITAL_BASED_OUTPATIENT_CLINIC_OR_DEPARTMENT_OTHER): Payer: Self-pay

## 2024-02-13 DIAGNOSIS — Z1231 Encounter for screening mammogram for malignant neoplasm of breast: Secondary | ICD-10-CM | POA: Insufficient documentation

## 2024-02-15 ENCOUNTER — Ambulatory Visit: Payer: Self-pay | Admitting: Family Medicine

## 2024-02-16 ENCOUNTER — Encounter: Payer: Self-pay | Admitting: Gastroenterology

## 2024-02-16 ENCOUNTER — Ambulatory Visit: Admitting: Family Medicine

## 2024-02-20 ENCOUNTER — Ambulatory Visit (INDEPENDENT_AMBULATORY_CARE_PROVIDER_SITE_OTHER): Admitting: Sports Medicine

## 2024-02-20 ENCOUNTER — Other Ambulatory Visit (INDEPENDENT_AMBULATORY_CARE_PROVIDER_SITE_OTHER): Payer: Self-pay

## 2024-02-20 DIAGNOSIS — M542 Cervicalgia: Secondary | ICD-10-CM | POA: Diagnosis not present

## 2024-02-20 DIAGNOSIS — M5030 Other cervical disc degeneration, unspecified cervical region: Secondary | ICD-10-CM | POA: Diagnosis not present

## 2024-02-20 DIAGNOSIS — M4310 Spondylolisthesis, site unspecified: Secondary | ICD-10-CM

## 2024-02-20 DIAGNOSIS — G8929 Other chronic pain: Secondary | ICD-10-CM | POA: Diagnosis not present

## 2024-02-20 DIAGNOSIS — M5441 Lumbago with sciatica, right side: Secondary | ICD-10-CM | POA: Diagnosis not present

## 2024-02-20 DIAGNOSIS — M5442 Lumbago with sciatica, left side: Secondary | ICD-10-CM
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
## 2024-02-20 NOTE — Progress Notes (Unsigned)
 Patient says that she has had low back pain and neck pain for years. She was evaluated for her low back pain in 2023, but was not able to continue with treatment for her back pain due to caring for her husband. She believes that her back pain comes from training with the [employer] when she was 63 years old. She does have pain and tingling in the legs, and pain, numbness, and tingling in the arms. She says that her back pain gets worse when she stands for extended amounts of time, although sitting in some positions can worsen her pain, as well.   She has done physical therapy in the past. She takes Meloxicam  with little relief. She will use a heating pad. Due to labs at her most recent physical, she has not been taking Ibuprofen, but says that she was previously taking 800mg  twice daily.

## 2024-02-21 ENCOUNTER — Encounter: Payer: Self-pay | Admitting: Sports Medicine

## 2024-02-21 MED ORDER — CYCLOBENZAPRINE HCL 5 MG PO TABS: 5.0000 mg | ORAL_TABLET | Freq: Two times a day (BID) | ORAL | 0 refills | Status: AC | PRN

## 2024-02-22 ENCOUNTER — Other Ambulatory Visit: Payer: Self-pay | Admitting: Family Medicine

## 2024-02-24 ENCOUNTER — Ambulatory Visit (AMBULATORY_SURGERY_CENTER): Admitting: Gastroenterology

## 2024-02-24 ENCOUNTER — Encounter: Payer: Self-pay | Admitting: Gastroenterology

## 2024-02-24 VITALS — BP 175/105 | HR 86 | Temp 98.0°F | Resp 15 | Ht 62.0 in | Wt 197.0 lb

## 2024-02-24 DIAGNOSIS — K209 Esophagitis, unspecified without bleeding: Secondary | ICD-10-CM

## 2024-02-24 DIAGNOSIS — K449 Diaphragmatic hernia without obstruction or gangrene: Secondary | ICD-10-CM | POA: Diagnosis not present

## 2024-02-24 DIAGNOSIS — K219 Gastro-esophageal reflux disease without esophagitis: Secondary | ICD-10-CM | POA: Diagnosis not present

## 2024-02-24 DIAGNOSIS — K208 Other esophagitis without bleeding: Secondary | ICD-10-CM | POA: Diagnosis not present

## 2024-02-24 DIAGNOSIS — K2270 Barrett's esophagus without dysplasia: Secondary | ICD-10-CM

## 2024-02-24 DIAGNOSIS — Z9889 Other specified postprocedural states: Secondary | ICD-10-CM

## 2024-02-24 MED ORDER — VOQUEZNA 20 MG PO TABS
20.0000 mg | ORAL_TABLET | Freq: Every day | ORAL | 3 refills | Status: AC
Start: 2024-02-24 — End: 2024-04-25

## 2024-02-24 MED ORDER — SODIUM CHLORIDE 0.9 % IV SOLN: 500.0000 mL | INTRAVENOUS | Status: DC

## 2024-02-24 NOTE — Op Note (Signed)
 [facility] Patient Name: Diana Spencer Procedure Date: 02/24/2024 10:30 AM MRN: [id number] Endoscopist: Gustav ALONSO Mcgee , MD, [id number] Age: 63 Referring MD:  Date of Birth: 04/22/1961 Gender: Female Account #: [id number] Procedure:                Upper GI endoscopy Indications:              Esophageal reflux symptoms that persist despite                            appropriate therapy on PPI and H2 blocker BID ,                            Esophageal reflux symptoms that recur despite                            appropriate therapy s/p Nissen fundoplication in                            2015 Medicines:                Monitored Anesthesia Care Procedure:                Pre-Anesthesia Assessment:                           - Prior to the procedure, a History and Physical                            was performed, and patient medications and                            allergies were reviewed. The patient's tolerance of                            previous anesthesia was also reviewed. The risks                            and benefits of the procedure and the sedation                            options and risks were discussed with the patient.                            All questions were answered, and informed consent                            was obtained. Prior Anticoagulants: The patient has                            taken no anticoagulant or antiplatelet agents. ASA                            Grade Assessment: II - A patient with mild systemic  disease. After reviewing the risks and benefits,                            the patient was deemed in satisfactory condition to                            undergo the procedure.                           After obtaining informed consent, the endoscope was                            passed under direct vision. Throughout the                            procedure, the patient's blood pressure, pulse, and                             oxygen saturations were monitored continuously. The                            GIF [id number] #[id number] was introduced through the                            mouth, and advanced to the second part of duodenum.                            The upper GI endoscopy was accomplished without                            difficulty. The patient tolerated the procedure                            well. Scope In: Scope Out: Findings:                 The esophagus and gastroesophageal junction were                            examined with white light and narrow band imaging                            (NBI) from a forward view and retroflexed position.                            There were esophageal mucosal changes consistent                            with short-segment Barrett's esophagus. These                            changes involved the mucosa at the upper extent of                            the gastric folds (33  cm from the incisors)                            extending to the Z-line (30 cm from the incisors).                            The maximum longitudinal extent of these esophageal                            mucosal changes was 3 cm in length. Mucosa was                            biopsied with a cold forceps for histology in a                            targeted manner. One specimen bottle was sent to                            pathology.                           Prior Nissen fundoplication was found at the                            gastroesophageal junction.                           A small paraesophageal hernia was found.                           The exam of the stomach was otherwise normal.                           The examined duodenum was normal. Complications:            No immediate complications. Estimated Blood Loss:     Estimated blood loss was minimal. Impression:               - Esophageal mucosal changes consistent with                             short-segment Barrett's esophagus. Biopsied.                           - Nissen fundoplication was found.                           - Small paraesophageal hernia.                           - Normal examined duodenum. Recommendation:           - Patient has a contact number available for                            emergencies. The signs and symptoms of potential  delayed complications were discussed with the                            patient. Return to normal activities tomorrow.                            Written discharge instructions were provided to the                            patient.                           - Resume previous diet.                           - Continue present medications.                           - Await pathology results.                           - Follow an antireflux regimen.                           - Voquezna 20mg  daily X 60 days followed by 10mg                             daily                           - DC Nexium  and Pepcid                            - Barium esophagogram to be scheduled                           - Follow up in GI office after Barium study, next                            available appt Gustav ALONSO Mcgee, MD 02/24/2024 10:58:20 AM This report has been signed electronically.

## 2024-02-24 NOTE — Patient Instructions (Addendum)
 Resume previous diet Continue present medications Await pathology results Follow an antireflux regiment Voquezna - 20 mg daily for 60 days, followed by 10 mg daily Stop Nexium  and Pepcid  Barium esophagogram to be scheduled Follow up in GI office after barium study, next available appointment   YOU HAD AN ENDOSCOPIC PROCEDURE TODAY AT THE [facility]:   Refer to the procedure report that was given to you for any specific questions about what was found during the examination.  If the procedure report does not answer your questions, please call your gastroenterologist to clarify.  If you requested that your care partner not be given the details of your procedure findings, then the procedure report has been included in a sealed envelope for you to review at your convenience later.  YOU SHOULD EXPECT: Some feelings of bloating in the abdomen. Passage of more gas than usual.  Walking can help get rid of the air that was put into your GI tract during the procedure and reduce the bloating. If you had a lower endoscopy (such as a colonoscopy or flexible sigmoidoscopy) you may notice spotting of blood in your stool or on the toilet paper. If you underwent a bowel prep for your procedure, you may not have a normal bowel movement for a few days.  Please Note:  You might notice some irritation and congestion in your nose or some drainage.  This is from the oxygen used during your procedure.  There is no need for concern and it should clear up in a day or so.  SYMPTOMS TO REPORT IMMEDIATELY:  Following upper endoscopy (EGD)  Vomiting of blood or coffee ground material  New chest pain or pain under the shoulder blades  Painful or persistently difficult swallowing  New shortness of breath  Fever of 100F or higher  Black, tarry-looking stools  For urgent or emergent issues, a gastroenterologist can be reached at any hour by calling [phone]. Do not use MyChart messaging for urgent  concerns.    DIET:  We do recommend a small meal at first, but then you may proceed to your regular diet.  Drink plenty of fluids but you should avoid alcoholic beverages for 24 hours.  ACTIVITY:  You should plan to take it easy for the rest of today and you should NOT DRIVE or use heavy machinery until tomorrow (because of the sedation medicines used during the test).    FOLLOW UP: Our staff will call the number listed on your records the next business day following your procedure.  We will call around 7:15- 8:00 am to check on you and address any questions or concerns that you may have regarding the information given to you following your procedure. If we do not reach you, we will leave a message.     If any biopsies were taken you will be contacted by phone or by letter within the next 1-3 weeks.  Please call us  at [phone] if you have not heard about the biopsies in 3 weeks.    SIGNATURES/CONFIDENTIALITY: You and/or your care partner have signed paperwork which will be entered into your electronic medical record.  These signatures attest to the fact that that the information above on your After Visit Summary has been reviewed and is understood.  Full responsibility of the confidentiality of this discharge information lies with you and/or your care-partner.
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
## 2024-02-24 NOTE — Progress Notes (Signed)
 To pacu, VSS. Report to Rn.tb

## 2024-02-24 NOTE — Progress Notes (Signed)
 Called to room to assist during endoscopic procedure.  Patient ID and intended procedure confirmed with present staff. Received instructions for my participation in the procedure from the performing physician.

## 2024-02-27 ENCOUNTER — Other Ambulatory Visit (HOSPITAL_COMMUNITY): Payer: Self-pay

## 2024-02-27 ENCOUNTER — Telehealth: Payer: Self-pay | Admitting: *Deleted

## 2024-02-27 ENCOUNTER — Telehealth: Payer: Self-pay

## 2024-02-27 NOTE — Telephone Encounter (Signed)
 No answer on  follow up call. Left message.

## 2024-02-27 NOTE — Telephone Encounter (Signed)
 Patient notified via my chart message. She does check her messages.

## 2024-02-27 NOTE — Telephone Encounter (Signed)
 Pharmacy Patient Advocate Encounter   Received notification from CoverMyMeds that prior authorization for Voquezna 20MG  tablets is required/requested.   Insurance verification completed.   The patient is insured through [facility] .   Prior Authorization for Voquezna 20MG  tablets has been APPROVED from 01-28-2024 to 08-25-2024   PA #/Case ID/Reference #: [id number]

## 2024-03-01 ENCOUNTER — Encounter: Payer: Self-pay | Admitting: *Deleted

## 2024-03-01 ENCOUNTER — Other Ambulatory Visit: Payer: Self-pay | Admitting: *Deleted

## 2024-03-01 DIAGNOSIS — R110 Nausea: Secondary | ICD-10-CM

## 2024-03-01 DIAGNOSIS — K219 Gastro-esophageal reflux disease without esophagitis: Secondary | ICD-10-CM

## 2024-03-01 DIAGNOSIS — K2270 Barrett's esophagus without dysplasia: Secondary | ICD-10-CM

## 2024-03-01 DIAGNOSIS — Z9889 Other specified postprocedural states: Secondary | ICD-10-CM

## 2024-03-01 DIAGNOSIS — R1084 Generalized abdominal pain: Secondary | ICD-10-CM

## 2024-03-01 LAB — SURGICAL PATHOLOGY

## 2024-03-09 ENCOUNTER — Encounter: Payer: Self-pay | Admitting: Physical Therapy

## 2024-03-09 ENCOUNTER — Other Ambulatory Visit: Payer: Self-pay

## 2024-03-09 ENCOUNTER — Ambulatory Visit: Admitting: Physical Therapy

## 2024-03-09 DIAGNOSIS — M6281 Muscle weakness (generalized): Secondary | ICD-10-CM | POA: Diagnosis not present

## 2024-03-09 DIAGNOSIS — R2689 Other abnormalities of gait and mobility: Secondary | ICD-10-CM

## 2024-03-09 DIAGNOSIS — M542 Cervicalgia: Secondary | ICD-10-CM

## 2024-03-09 DIAGNOSIS — M5459 Other low back pain: Secondary | ICD-10-CM
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
## 2024-03-12 ENCOUNTER — Ambulatory Visit: Admitting: Physical Therapy

## 2024-03-12 ENCOUNTER — Encounter: Payer: Self-pay | Admitting: Physical Therapy

## 2024-03-12 DIAGNOSIS — M542 Cervicalgia: Secondary | ICD-10-CM | POA: Diagnosis not present

## 2024-03-12 DIAGNOSIS — R2689 Other abnormalities of gait and mobility: Secondary | ICD-10-CM | POA: Diagnosis not present

## 2024-03-12 DIAGNOSIS — M5459 Other low back pain: Secondary | ICD-10-CM

## 2024-03-12 DIAGNOSIS — M6281 Muscle weakness (generalized): Secondary | ICD-10-CM
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
## 2024-03-20 ENCOUNTER — Encounter

## 2024-03-20 ENCOUNTER — Ambulatory Visit (HOSPITAL_COMMUNITY)
Admission: RE | Admit: 2024-03-20 | Discharge: 2024-03-20 | Disposition: A | Discharge status: Home / Self Care | Source: Ambulatory Visit | Attending: Gastroenterology | Admitting: Gastroenterology

## 2024-03-20 DIAGNOSIS — K2270 Barrett's esophagus without dysplasia: Secondary | ICD-10-CM | POA: Insufficient documentation

## 2024-03-20 DIAGNOSIS — Z9889 Other specified postprocedural states: Secondary | ICD-10-CM | POA: Diagnosis not present

## 2024-03-20 DIAGNOSIS — K219 Gastro-esophageal reflux disease without esophagitis: Secondary | ICD-10-CM | POA: Diagnosis not present

## 2024-03-20 DIAGNOSIS — R109 Unspecified abdominal pain: Secondary | ICD-10-CM | POA: Diagnosis not present

## 2024-03-20 DIAGNOSIS — R1084 Generalized abdominal pain: Secondary | ICD-10-CM | POA: Diagnosis not present

## 2024-03-20 DIAGNOSIS — K224 Dyskinesia of esophagus: Secondary | ICD-10-CM | POA: Diagnosis not present

## 2024-03-20 DIAGNOSIS — R110 Nausea: Secondary | ICD-10-CM | POA: Insufficient documentation

## 2024-03-22 ENCOUNTER — Other Ambulatory Visit: Payer: Self-pay

## 2024-03-22 ENCOUNTER — Ambulatory Visit: Admitting: Physical Medicine and Rehabilitation

## 2024-03-22 VITALS — BP 134/82 | HR 87

## 2024-03-22 DIAGNOSIS — M47816 Spondylosis without myelopathy or radiculopathy, lumbar region: Secondary | ICD-10-CM

## 2024-03-22 MED ORDER — METHYLPREDNISOLONE ACETATE 40 MG/ML IJ SUSP
40.0000 mg | Freq: Once | INTRAMUSCULAR | Status: AC
  Administered 2024-03-22: 40 mg

## 2024-03-22 NOTE — Patient Instructions (Signed)

## 2024-03-22 NOTE — Progress Notes (Signed)
 Pain Scale   Average Pain 4 Patient advising she has lower back pain radiating to left leg. Patient advising her pain increases when walking and standing and get better when she sits.        +Driver, -BT, -Dye Allergies.

## 2024-03-23 ENCOUNTER — Ambulatory Visit: Payer: Self-pay | Admitting: Gastroenterology

## 2024-03-23 NOTE — Telephone Encounter (Signed)
 Referral information faxed to [facility].

## 2024-03-23 NOTE — Progress Notes (Signed)
Fax Confirmation Received
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
## 2024-03-26 ENCOUNTER — Ambulatory Visit

## 2024-03-26 DIAGNOSIS — R2689 Other abnormalities of gait and mobility: Secondary | ICD-10-CM

## 2024-03-26 DIAGNOSIS — M5459 Other low back pain: Secondary | ICD-10-CM

## 2024-03-26 DIAGNOSIS — M6281 Muscle weakness (generalized): Secondary | ICD-10-CM | POA: Diagnosis not present

## 2024-03-26 DIAGNOSIS — M542 Cervicalgia: Secondary | ICD-10-CM

## 2024-03-26 NOTE — Procedures (Signed)
 Lumbar Diagnostic Facet Joint Nerve Block with Fluoroscopic Guidance   Patient: Diana Spencer      Date of Birth: [birth year] MRN: [id number] PCP: Catherine Charlies LABOR, DO      Visit Date: 03/22/2024   Universal Protocol:    Date/Time: 07/28/256:44 AM  Consent Given By: the patient  Position: PRONE  Additional Comments: Vital signs were monitored before and after the procedure. Patient was prepped and draped in the usual sterile fashion. The correct patient, procedure, and site was verified.   Injection Procedure Details:   Procedure diagnoses:  1. Spondylosis without myelopathy or radiculopathy, lumbar region      Meds Administered:  Meds ordered this encounter  Medications   methylPREDNISolone  acetate (DEPO-MEDROL ) injection 40 mg     Laterality: Right  Location/Site: Right pars interarticularis  Needle: 5.0 in., 25 ga.  Short bevel or Quincke spinal needle  Needle Placement: Oblique pedical  Findings:   -Comments: There was excellent flow of contrast along the articular pillars without intravascular flow.  Procedure Details: The fluoroscope beam is vertically oriented in AP and then obliqued 15 to 20 degrees to the ipsilateral side of the desired nerve to achieve the "Scotty dog" appearance.  The skin over the target area of the pars interarticularis was locally anesthetized with a 1 ml volume of 1% Lidocaine  without Epinephrine.  The spinal needle was inserted and advanced in a trajectory view down to the target.   After contact with periosteum and negative aspirate for blood and CSF, correct placement without intravascular or epidural spread was confirmed by injecting 0.5 ml. of Isovue-250.  A spot radiograph was obtained of this image.    Next, 1.0 mL volume of the injectate described above was injected.     Additional Comments:  No complications occurred Dressing: 2 x 2 sterile gauze and Band-Aid    Post-procedure details: Patient was observed during the  procedure. Post-procedure instructions were reviewed.  Patient left the clinic in stable condition.

## 2024-03-26 NOTE — Progress Notes (Signed)
 Diana Spencer - 63 y.o. female MRN [id number]  Date of birth: 1961-04-22  Office Visit Note: Visit Date: 03/22/2024 PCP: Catherine Charlies LABOR, DO Referred by: Burnetta Brunet, DO  Subjective: Chief Complaint  Patient presents with   Lower Back - Pain   HPI:  Diana Spencer is a 63 y.o. female who comes in today at the request of Dr. Brunet Burnetta for planned Right  L5-S1 Lumbar facet/pars interarticularis injection with fluoroscopic guidance.  The patient has failed conservative care including home exercise, medications, time and activity modification.  This injection will be diagnostic and hopefully therapeutic.  Please see requesting physician notes for further details and justification.  Exam has shown concordant pain with facet joint loading.    ROS Otherwise per HPI.  Assessment & Plan: Visit Diagnoses:    ICD-10-CM   1. Spondylosis without myelopathy or radiculopathy, lumbar region  M47.816 XR C-ARM NO REPORT    Facet Injection    methylPREDNISolone  acetate (DEPO-MEDROL ) injection 40 mg      Plan: No additional findings.   Meds & Orders:  Meds ordered this encounter  Medications   methylPREDNISolone  acetate (DEPO-MEDROL ) injection 40 mg    Orders Placed This Encounter  Procedures   Facet Injection   XR C-ARM NO REPORT    Follow-up: Return for Brunet Burnetta, DO.   Procedures: No procedures performed  Lumbar Diagnostic Facet Joint Nerve Block with Fluoroscopic Guidance   Patient: Diana Spencer      Date of Birth: Apr 02, 1961 MRN: [id number] PCP: Catherine Charlies LABOR, DO      Visit Date: 03/22/2024   Universal Protocol:    Date/Time: 07/28/256:44 AM  Consent Given By: the patient  Position: PRONE  Additional Comments: Vital signs were monitored before and after the procedure. Patient was prepped and draped in the usual sterile fashion. The correct patient, procedure, and site was verified.   Injection Procedure Details:   Procedure diagnoses:  1. Spondylosis without  myelopathy or radiculopathy, lumbar region      Meds Administered:  Meds ordered this encounter  Medications   methylPREDNISolone  acetate (DEPO-MEDROL ) injection 40 mg     Laterality: Right  Location/Site: Right pars interarticularis  Needle: 5.0 in., 25 ga.  Short bevel or Quincke spinal needle  Needle Placement: Oblique pedical  Findings:   -Comments: There was excellent flow of contrast along the articular pillars without intravascular flow.  Procedure Details: The fluoroscope beam is vertically oriented in AP and then obliqued 15 to 20 degrees to the ipsilateral side of the desired nerve to achieve the "Scotty dog" appearance.  The skin over the target area of the pars interarticularis was locally anesthetized with a 1 ml volume of 1% Lidocaine  without Epinephrine.  The spinal needle was inserted and advanced in a trajectory view down to the target.   After contact with periosteum and negative aspirate for blood and CSF, correct placement without intravascular or epidural spread was confirmed by injecting 0.5 ml. of Isovue-250.  A spot radiograph was obtained of this image.    Next, 1.0 mL volume of the injectate described above was injected.     Additional Comments:  No complications occurred Dressing: 2 x 2 sterile gauze and Band-Aid    Post-procedure details: Patient was observed during the procedure. Post-procedure instructions were reviewed.  Patient left the clinic in stable condition.    Clinical History: CLINICAL DATA:  Low back pain for 20 years.   EXAM: MRI LUMBAR SPINE WITHOUT CONTRAST   TECHNIQUE: Multiplanar,  multisequence MR imaging of the lumbar spine was performed. No intravenous contrast was administered.   COMPARISON:  None Available.   FINDINGS: Segmentation:  Standard.   Alignment: Grade 1 anterolisthesis of L5 on S1 secondary to facet disease and right L5 pars interarticularis defect.   Vertebrae: No acute fracture, evidence of  discitis, or aggressive bone lesion.   Conus medullaris and cauda equina: Conus extends to the L1 level. Conus and cauda equina appear normal.   Paraspinal and other soft tissues: No acute paraspinal abnormality.   Disc levels:   Disc spaces: Degenerative disease with disc height loss at L5-S1.   T12-L1: No disc protrusion, foraminal stenosis or central canal stenosis.   L1-L2: No significant disc bulge. No neural foraminal stenosis. No central canal stenosis.   L2-L3: No significant disc bulge. No neural foraminal stenosis. No central canal stenosis.   L3-L4: No significant disc bulge. No neural foraminal stenosis. No central canal stenosis.   L4-L5: Minimal broad-based disc bulge. Mild left facet arthropathy. No foraminal or central canal stenosis.   L5-S1: Mild broad-based disc bulge. Mild left foraminal stenosis. No right foraminal stenosis. Mild left facet arthropathy.   IMPRESSION: 1. At L4-5 there is a minimal broad-based disc bulge. Mild left facet arthropathy. 2. At L5-S1 there is a mild broad-based disc bulge. Mild left foraminal stenosis. No right foraminal stenosis. Grade 1 anterolisthesis of L5 on S1 secondary to facet disease and right L5 pars interarticularis defect. 3.  No acute osseous injury of the lumbar spine.     Electronically Signed   By: Julaine Blanch M.D.   On: 02/19/2022 09:02     Objective:  VS:  HT:    WT:   BMI:     BP:134/82  HR:87bpm  TEMP: ( )  RESP:  Physical Exam Vitals and nursing note reviewed.  Constitutional:      General: She is not in acute distress.    Appearance: Normal appearance. She is not ill-appearing.  HENT:     Head: Normocephalic and atraumatic.     Right Ear: External ear normal.     Left Ear: External ear normal.  Eyes:     Extraocular Movements: Extraocular movements intact.  Cardiovascular:     Rate and Rhythm: Normal rate.     Pulses: Normal pulses.  Pulmonary:     Effort: Pulmonary effort is  normal. No respiratory distress.  Abdominal:     General: There is no distension.     Palpations: Abdomen is soft.  Musculoskeletal:        General: Tenderness present.     Cervical back: Neck supple.     Right lower leg: No edema.     Left lower leg: No edema.     Comments: Patient has good distal strength with no pain over the greater trochanters.  No clonus or focal weakness.  Skin:    Findings: No erythema, lesion or rash.  Neurological:     General: No focal deficit present.     Mental Status: She is alert and oriented to person, place, and time.     Sensory: No sensory deficit.     Motor: No weakness or abnormal muscle tone.     Coordination: Coordination normal.  Psychiatric:        Mood and Affect: Mood normal.        Behavior: Behavior normal.      Imaging: No results found.
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
## 2024-03-29 ENCOUNTER — Ambulatory Visit

## 2024-03-29 DIAGNOSIS — M542 Cervicalgia: Secondary | ICD-10-CM

## 2024-03-29 DIAGNOSIS — R2689 Other abnormalities of gait and mobility: Secondary | ICD-10-CM

## 2024-03-29 DIAGNOSIS — M5459 Other low back pain: Secondary | ICD-10-CM | POA: Diagnosis not present

## 2024-03-29 DIAGNOSIS — M6281 Muscle weakness (generalized): Secondary | ICD-10-CM | POA: Diagnosis not present

## 2024-04-02 ENCOUNTER — Ambulatory Visit

## 2024-04-02 DIAGNOSIS — M5459 Other low back pain: Secondary | ICD-10-CM

## 2024-04-02 DIAGNOSIS — R2689 Other abnormalities of gait and mobility: Secondary | ICD-10-CM

## 2024-04-02 DIAGNOSIS — M542 Cervicalgia: Secondary | ICD-10-CM

## 2024-04-02 DIAGNOSIS — M6281 Muscle weakness (generalized): Secondary | ICD-10-CM | POA: Diagnosis not present
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
## 2024-04-04 ENCOUNTER — Encounter

## 2024-04-05 ENCOUNTER — Other Ambulatory Visit (HOSPITAL_BASED_OUTPATIENT_CLINIC_OR_DEPARTMENT_OTHER)

## 2024-04-05 ENCOUNTER — Other Ambulatory Visit: Payer: Self-pay | Admitting: Family Medicine
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
## 2024-04-09 ENCOUNTER — Ambulatory Visit

## 2024-04-09 DIAGNOSIS — M5459 Other low back pain: Secondary | ICD-10-CM | POA: Diagnosis not present

## 2024-04-09 DIAGNOSIS — M542 Cervicalgia: Secondary | ICD-10-CM

## 2024-04-09 DIAGNOSIS — M6281 Muscle weakness (generalized): Secondary | ICD-10-CM

## 2024-04-09 DIAGNOSIS — R2689 Other abnormalities of gait and mobility: Secondary | ICD-10-CM | POA: Diagnosis not present

## 2024-04-11 ENCOUNTER — Ambulatory Visit

## 2024-04-11 DIAGNOSIS — M542 Cervicalgia: Secondary | ICD-10-CM

## 2024-04-11 DIAGNOSIS — M5459 Other low back pain: Secondary | ICD-10-CM | POA: Diagnosis not present

## 2024-04-11 DIAGNOSIS — R2689 Other abnormalities of gait and mobility: Secondary | ICD-10-CM | POA: Diagnosis not present

## 2024-04-11 DIAGNOSIS — M6281 Muscle weakness (generalized): Secondary | ICD-10-CM
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
## 2024-04-12 DIAGNOSIS — K9189 Other postprocedural complications and disorders of digestive system: Secondary | ICD-10-CM | POA: Diagnosis not present

## 2024-04-12 DIAGNOSIS — Z9889 Other specified postprocedural states: Secondary | ICD-10-CM | POA: Diagnosis not present

## 2024-04-12 DIAGNOSIS — K449 Diaphragmatic hernia without obstruction or gangrene: Secondary | ICD-10-CM | POA: Diagnosis not present

## 2024-05-02 ENCOUNTER — Other Ambulatory Visit (HOSPITAL_BASED_OUTPATIENT_CLINIC_OR_DEPARTMENT_OTHER)

## 2024-05-09 ENCOUNTER — Ambulatory Visit (HOSPITAL_BASED_OUTPATIENT_CLINIC_OR_DEPARTMENT_OTHER)
Admission: RE | Admit: 2024-05-09 | Discharge: 2024-05-09 | Disposition: A | Discharge status: Home / Self Care | Source: Ambulatory Visit | Attending: Family Medicine | Admitting: Family Medicine

## 2024-05-09 DIAGNOSIS — M8589 Other specified disorders of bone density and structure, multiple sites: Secondary | ICD-10-CM | POA: Diagnosis not present

## 2024-05-09 DIAGNOSIS — E2839 Other primary ovarian failure: Secondary | ICD-10-CM | POA: Diagnosis not present

## 2024-05-09 DIAGNOSIS — Z780 Asymptomatic menopausal state: Secondary | ICD-10-CM | POA: Diagnosis not present

## 2024-05-16 ENCOUNTER — Ambulatory Visit: Admitting: Gastroenterology

## 2024-05-16 ENCOUNTER — Encounter: Payer: Self-pay | Admitting: Gastroenterology

## 2024-05-16 VITALS — BP 112/84 | HR 90 | Ht 62.0 in | Wt 195.4 lb

## 2024-05-16 DIAGNOSIS — K219 Gastro-esophageal reflux disease without esophagitis: Secondary | ICD-10-CM

## 2024-05-16 DIAGNOSIS — K449 Diaphragmatic hernia without obstruction or gangrene: Secondary | ICD-10-CM | POA: Diagnosis not present

## 2024-05-16 DIAGNOSIS — Z9889 Other specified postprocedural states: Secondary | ICD-10-CM

## 2024-05-16 MED ORDER — FAMOTIDINE 20 MG PO TABS: 20.0000 mg | ORAL_TABLET | Freq: Two times a day (BID) | ORAL | 1 refills | Status: AC

## 2024-05-16 MED ORDER — ESOMEPRAZOLE MAGNESIUM 20 MG PO CPDR: 20.0000 mg | DELAYED_RELEASE_CAPSULE | Freq: Two times a day (BID) | ORAL | 1 refills | Status: AC

## 2024-05-16 MED ORDER — DICYCLOMINE HCL 10 MG PO CAPS: ORAL_CAPSULE | ORAL | 5 refills | Status: AC

## 2024-05-16 NOTE — Patient Instructions (Addendum)
 We have sent the following medications to your pharmacy for you to pick up at your convenience: Nexium , famotidine  and bentyl .   You have been scheduled for an esophageal manometry test at [facility] on 08/15/24 at 10:30am. Please arrive 30 minutes prior to your procedure for registration. You will need to go to outpatient registration (1st floor of the hospital) first. Make certain to bring your insurance cards as well as a complete list of medications.  Please remember the following:  1) Do not take any muscle relaxants, xanax (alprazolam) or ativan for 1 day prior to your test as well as the day of the test.  2) Nothing to eat or drink after 12:00 midnight on the night before your test.  3) Hold all diabetic medications/insulin the morning of the test. You may eat and take your medications after the test.  It will take at least 2 weeks to receive the results of this test from your physician.  ------------------------------------------ ABOUT ESOPHAGEAL MANOMETRY Esophageal manometry (muh-NOM-uh-tree) is a test that gauges how well your esophagus works. Your esophagus is the long, muscular tube that connects your throat to your stomach. Esophageal manometry measures the rhythmic muscle contractions (peristalsis) that occur in your esophagus when you swallow. Esophageal manometry also measures the coordination and force exerted by the muscles of your esophagus.  During esophageal manometry, a thin, flexible tube (catheter) that contains sensors is passed through your nose, down your esophagus and into your stomach. Esophageal manometry can be helpful in diagnosing some mostly uncommon disorders that affect your esophagus.  Why it's done Esophageal manometry is used to evaluate the movement (motility) of food through the esophagus and into the stomach. The test measures how well the circular bands of muscle (sphincters) at the top and bottom of your esophagus open and close, as well as  the pressure, strength and pattern of the wave of esophageal muscle contractions that moves food along.  What you can expect Esophageal manometry is an outpatient procedure done without sedation. Most people tolerate it well. You may be asked to change into a hospital gown before the test starts.  During esophageal manometry  While you are sitting up, a member of your health care team sprays your throat with a numbing medication or puts numbing gel in your nose or both.  A catheter is guided through your nose into your esophagus. The catheter may be sheathed in a water-filled sleeve. It doesn't interfere with your breathing. However, your eyes may water, and you may gag. You may have a slight nosebleed from irritation.  After the catheter is in place, you may be asked to lie on your back on an exam table, or you may be asked to remain seated.  You then swallow small sips of water. As you do, a computer connected to the catheter records the pressure, strength and pattern of your esophageal muscle contractions.  During the test, you'll be asked to breathe slowly and smoothly, remain as still as possible, and swallow only when you're asked to do so.  A member of your health care team may move the catheter down into your stomach while the catheter continues its measurements.  The catheter then is slowly withdrawn. The test usually lasts 20 to 30 minutes.  After esophageal manometry  When your esophageal manometry is complete, you may return to your normal activities  This test typically takes 30-45 minutes to complete. ________________________________________________________________________________  _______________________________________________________  If your blood pressure at your visit was 140/90  or greater, please contact your primary care physician to follow up on this.  _______________________________________________________  If you are age 22 or older, your body mass index should be  between 23-30. Your Body mass index is 35.73 kg/m. If this is out of the aforementioned range listed, please consider follow up with your Primary Care Provider.  If you are age 35 or younger, your body mass index should be between 19-25. Your Body mass index is 35.73 kg/m. If this is out of the aformentioned range listed, please consider follow up with your Primary Care Provider.   ________________________________________________________  The [facility] providers would like to encourage you to use MYCHART to communicate with providers for non-urgent requests or questions.  Due to long hold times on the telephone, sending your provider a message by [facility] may be a faster and more efficient way to get a response.  Please allow 48 business hours for a response.  Please remember that this is for non-urgent requests.  _______________________________________________________  [facility] is using a team-based approach to care.  Your team is made up of your doctor and two to three APPS. Our APPS (Nurse Practitioners and Physician Assistants) work with your physician to ensure care continuity for you. They are fully qualified to address your health concerns and develop a treatment plan. They communicate directly with your gastroenterologist to care for you. Seeing the Advanced Practice Practitioners on your physician's team can help you by facilitating care more promptly, often allowing for earlier appointments, access to diagnostic testing, procedures, and other specialty referrals.   VISIT SUMMARY:  You had a follow-up visit to discuss your GERD, hiatal hernia, and recent abdominal pain. Your GERD is well-controlled with your current medications, and we made some adjustments to your dosage schedule. We also discussed your hiatal hernia and planned a manometry test for November. Your recent abdominal pain, likely related to diverticulitis, has resolved.  YOUR PLAN:  GASTROESOPHAGEAL REFLUX  DISEASE (GERD): Your GERD symptoms are well-controlled with your current medications. -Take Nexium  in the morning and famotidine  in the evening, with an extra dose if needed. -You will receive a 90-day supply of your medications. -Voquezna  is not necessary and is not covered by your insurance.  RECURRENT HIATAL HERNIA: You have a recurrent hiatal hernia causing difficulty breathing when eating. -A manometry test is scheduled for December -Eat small meals and avoid lying down immediately after eating. -Drink water an hour after meals and finish meals 3-4 hours before bedtime. -Plan for surgical repair when you are ready.  RESOLVED EPISODE OF DIVERTICULITIS: You had a recent episode of abdominal pain similar to previous diverticulitis, which resolved on its own. -Continue taking dicyclomine  as needed for spasms. -Monitor for persistent pain and seek further investigation or antibiotics if the pain does not resolve.
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
## 2024-06-04 ENCOUNTER — Encounter: Payer: Self-pay | Admitting: Family Medicine

## 2024-06-04 NOTE — Telephone Encounter (Signed)
 Forms printed and placed in PCP office for review.

## 2024-06-05 NOTE — Telephone Encounter (Signed)
 Please see pt's chart for further encounter

## 2024-06-05 NOTE — Telephone Encounter (Signed)
 Please call [name]. I will do my best to try to help with the paperwork  Is the start date,  for the paperwork 05/18/2024 then? And end date would be 12 weeks later?  This also should be in Diana Spencer's chart, since he is the patient that is ill.   Dr.K

## 2024-06-17 ENCOUNTER — Other Ambulatory Visit: Payer: Self-pay | Admitting: Family Medicine

## 2024-06-22 ENCOUNTER — Encounter: Payer: Self-pay | Admitting: Family Medicine

## 2024-06-22 ENCOUNTER — Ambulatory Visit: Admitting: Family Medicine

## 2024-06-22 VITALS — BP 126/82 | HR 90 | Temp 97.5°F | Wt 187.6 lb

## 2024-06-22 DIAGNOSIS — F330 Major depressive disorder, recurrent, mild: Secondary | ICD-10-CM

## 2024-06-22 DIAGNOSIS — K219 Gastro-esophageal reflux disease without esophagitis: Secondary | ICD-10-CM | POA: Diagnosis not present

## 2024-06-22 DIAGNOSIS — E782 Mixed hyperlipidemia: Secondary | ICD-10-CM

## 2024-06-22 DIAGNOSIS — I10 Essential (primary) hypertension: Secondary | ICD-10-CM

## 2024-06-22 DIAGNOSIS — J4530 Mild persistent asthma, uncomplicated: Secondary | ICD-10-CM

## 2024-06-22 DIAGNOSIS — F419 Anxiety disorder, unspecified: Secondary | ICD-10-CM

## 2024-06-22 DIAGNOSIS — Z23 Encounter for immunization: Secondary | ICD-10-CM | POA: Diagnosis not present

## 2024-06-22 DIAGNOSIS — F4321 Adjustment disorder with depressed mood: Secondary | ICD-10-CM

## 2024-06-22 MED ORDER — DIAZEPAM 10 MG PO TABS: 5.0000 mg | ORAL_TABLET | Freq: Every day | ORAL | 5 refills | Status: AC | PRN

## 2024-06-22 MED ORDER — ONDANSETRON HCL 4 MG PO TABS
4.0000 mg | ORAL_TABLET | Freq: Three times a day (TID) | ORAL | 5 refills | Status: AC | PRN
Start: 2024-06-22 — End: ?

## 2024-06-22 MED ORDER — VENLAFAXINE HCL ER 75 MG PO CP24: 225.0000 mg | ORAL_CAPSULE | Freq: Every day | ORAL | 1 refills | Status: AC

## 2024-06-22 MED ORDER — AMLODIPINE BESYLATE 5 MG PO TABS: 5.0000 mg | ORAL_TABLET | Freq: Every day | ORAL | 1 refills | Status: AC

## 2024-06-22 MED ORDER — ROSUVASTATIN CALCIUM 40 MG PO TABS: 40.0000 mg | ORAL_TABLET | Freq: Every day | ORAL | 3 refills | Status: AC

## 2024-06-22 MED ORDER — MELOXICAM 15 MG PO TABS: 15.0000 mg | ORAL_TABLET | Freq: Every day | ORAL | 1 refills | Status: AC

## 2024-06-22 MED ORDER — BUDESONIDE-FORMOTEROL FUMARATE 160-4.5 MCG/ACT IN AERO: 2.0000 | INHALATION_SPRAY | Freq: Two times a day (BID) | RESPIRATORY_TRACT | 11 refills | Status: AC

## 2024-06-22 MED ORDER — ALBUTEROL SULFATE HFA 108 (90 BASE) MCG/ACT IN AERS: 1.0000 | INHALATION_SPRAY | Freq: Four times a day (QID) | RESPIRATORY_TRACT | 11 refills | Status: AC | PRN

## 2024-06-22 NOTE — Patient Instructions (Signed)
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
## 2024-06-27 ENCOUNTER — Other Ambulatory Visit: Admitting: Licensed Clinical Social Worker

## 2024-06-28 NOTE — Patient Instructions (Signed)
 Joen Clause - I am sorry I was unable to reach you today. I work with Catherine Fuller A, DO and am calling to support your healthcare needs. Please contact me at [phone] at your earliest convenience. I look forward to speaking with you soon.   Thank you,  Lyle Rung, BSW, MSW, LCSW Licensed Clinical Social Worker [employer]   [facility] [email or web address] Direct Dial: [phone]

## 2024-07-02 ENCOUNTER — Encounter: Payer: Self-pay | Admitting: Radiology

## 2024-07-04 ENCOUNTER — Other Ambulatory Visit: Payer: Self-pay | Admitting: Licensed Clinical Social Worker

## 2024-07-04 DIAGNOSIS — F419 Anxiety disorder, unspecified: Secondary | ICD-10-CM

## 2024-07-04 DIAGNOSIS — F330 Major depressive disorder, recurrent, mild: Secondary | ICD-10-CM

## 2024-07-04 NOTE — Patient Outreach (Signed)
 Complex Care Management   Visit Note  07/04/2024  Name:  Diana Spencer MRN: [id number] DOB: [birth year]  Situation: Referral received for Complex Care Management related to Mental/Behavioral Health diagnosis grief/anxiety. I obtained verbal consent from Patient.  Visit completed with Patient  on the phone  Background:   Past Medical History:  Diagnosis Date   Allergies    Allergy    Arthritis    Asthma    Chicken pox    Colon polyps    Diverticulitis    GAD (generalized anxiety disorder)    GERD (gastroesophageal reflux disease)    Hay fever    Hiatal hernia    HLD (hyperlipidemia)    HLP (hyperkeratosis lenticularis perstans)    Hypertension    Kidney stone 06/10/2021   MDD (major depressive disorder)    Seasonal allergies    Sinus problem    UTI (urinary tract infection)     Assessment: Patient Reported Symptoms:  Cognitive Cognitive Status: Able to follow simple commands, Alert and oriented to person, place, and time, Normal speech and language skills Cognitive/Intellectual Conditions Management [RPT]: Behavior Disorders Behavior Disorders: Anxiety   Health Maintenance Behaviors: Annual physical exam Healing Pattern: Average Health Facilitated by: Rest, Stress management  Neurological Neurological Review of Symptoms: Weakness Neurological Management Strategies: Coping strategies, Medication therapy Neurological Self-Management Outcome: 4 (good)  HEENT HEENT Symptoms Reported: No symptoms reported HEENT Management Strategies: Adequate rest, Routine screening HEENT Self-Management Outcome: 4 (good)    Cardiovascular Cardiovascular Symptoms Reported: Fatigue Does patient have uncontrolled Hypertension?: Yes Is patient checking Blood Pressure at home?: Yes Cardiovascular Management Strategies: Adequate rest, Medication therapy Cardiovascular Self-Management Outcome: 4 (good)  Respiratory Respiratory Symptoms Reported: No symptoms reported Other Respiratory  Symptoms: Asthma dx Respiratory Management Strategies: Adequate rest Respiratory Self-Management Outcome: 4 (good)  Endocrine Endocrine Symptoms Reported: No symptoms reported Is patient diabetic?: No Endocrine Self-Management Outcome: 4 (good)  Gastrointestinal Gastrointestinal Symptoms Reported: No symptoms reported Gastrointestinal Self-Management Outcome: 4 (good)    Genitourinary Genitourinary Symptoms Reported: Not assessed    Integumentary Integumentary Symptoms Reported: Not assessed    Musculoskeletal Musculoskelatal Symptoms Reviewed: Weakness Musculoskeletal Management Strategies: Adequate rest, Routine screening Musculoskeletal Self-Management Outcome: 4 (good) Falls in the past year?: No Number of falls in past year: 1 or less Was there an injury with Fall?: No Fall Risk Category Calculator: 0 Patient Fall Risk Level: Low Fall Risk    Psychosocial Psychosocial Symptoms Reported: Report of significant loss, deaths, abandonment, traumatic incidents Behavioral Management Strategies: Adequate rest, Medication therapy, Coping strategies Behavioral Health Self-Management Outcome: 2 (bad) Major Change/Loss/Stressor/Fears (CP): Death of a loved one Techniques to Cope with Loss/Stress/Change: Medication Quality of Family Relationships: helpful, involved Do you feel physically threatened by others?: No    07/04/2024    PHQ2-9 Depression Screening   Little interest or pleasure in doing things Several days  Feeling down, depressed, or hopeless More than half the days  PHQ-2 - Total Score 3  Trouble falling or staying asleep, or sleeping too much Nearly every day  Feeling tired or having little energy More than half the days  Poor appetite or overeating  Not at all  Feeling bad about yourself - or that you are a failure or have let yourself or your family down Several days  Trouble concentrating on things, such as reading the newspaper or watching television Not at all  Moving  or speaking so slowly that other people could have noticed.  Or the opposite - being so fidgety or  restless that you have been moving around a lot more than usual Not at all  Thoughts that you would be better off dead, or hurting yourself in some way Not at all  PHQ2-9 Total Score 9  If you checked off any problems, how difficult have these problems made it for you to do your work, take care of things at home, or get along with other people Not difficult at all  Depression Interventions/Treatment Medication    There were no vitals filed for this visit.  Medications Reviewed Today     Reviewed by Merlynn Lyle CROME, LCSW (Social Worker) on 07/04/24 at [phone]  Med List Status: <None>   Medication Order Taking? Sig Documenting Provider Last Dose Status Informant  albuterol  (VENTOLIN  HFA) 108 (90 Base) MCG/ACT inhaler [id number]  Inhale 1-2 puffs into the lungs every 6 (six) hours as needed for wheezing or shortness of breath. Kuneff, Renee A, DO  Active   amLODipine  (NORVASC ) 5 MG tablet [id number]  Take 1 tablet (5 mg total) by mouth daily. Kuneff, Renee A, DO  Active   budesonide -formoterol  (SYMBICORT ) 160-4.5 MCG/ACT inhaler [id number]  Inhale 2 puffs into the lungs 2 (two) times daily. Kuneff, Renee A, DO  Active   Cholecalciferol (VITAMIN D3 PO) [id number]  Take 5,000 Units by mouth. [provider]  Active   cyclobenzaprine  (FLEXERIL ) 5 MG tablet [id number]  Take 1-2 tablets (5-10 mg total) by mouth 2 (two) times daily as needed for muscle spasms. Brooks, Dana, DO  Active   diazepam  (VALIUM ) 10 MG tablet [id number]  Take 0.5-1 tablets (5-10 mg total) by mouth daily as needed for anxiety. Kuneff, Renee A, DO  Active   dicyclomine  (BENTYL ) 10 MG capsule [id number]  TAKE 1 CAPSULE (10 MG TOTAL) BY MOUTH 4 TIMES A DAY BEFORE MEALS AND AT BEDTIME Nandigam, Kavitha V, MD  Active   esomeprazole  (NEXIUM ) 20 MG capsule [id number]  Take 1 capsule (20 mg total) by mouth 2 (two) times daily before a meal.  Nandigam, Kavitha V, MD  Active   famotidine  (PEPCID ) 20 MG tablet [id number]  Take 1 tablet (20 mg total) by mouth 2 (two) times daily. Nandigam, Kavitha V, MD  Active   fexofenadine  (ALLEGRA ) 180 MG tablet [id number]  Take 180 mg by mouth as needed for allergies or rhinitis. [provider]  Active   Magnesium  250 MG TABS [id number]  Take by mouth. [provider]  Active   meloxicam  (MOBIC ) 15 MG tablet [id number]  Take 1 tablet (15 mg total) by mouth daily. Kuneff, Renee A, DO  Active   ondansetron  (ZOFRAN ) 4 MG tablet [id number]  Take 1 tablet (4 mg total) by mouth every 8 (eight) hours as needed. Kuneff, Renee A, DO  Active   rosuvastatin  (CRESTOR ) 40 MG tablet [id number]  Take 1 tablet (40 mg total) by mouth at bedtime. Kuneff, Renee A, DO  Active   venlafaxine  XR (EFFEXOR -XR) 75 MG 24 hr capsule [id number]  Take 3 capsules (225 mg total) by mouth daily with breakfast. Catherine, Renee A, DO  Active             Recommendation:   PCP Follow-up Referral to: [facility] Continue Current Plan of Care  Follow Up Plan:   Telephone follow-up in 1 month  Lyle Merlynn, BSW, MSW, LCSW Licensed Clinical Social Worker [employer]   [email or web address] Direct Dial: [phone]

## 2024-07-04 NOTE — Patient Instructions (Signed)
 Visit Information  Thank you for taking time to visit with me today. Please don't hesitate to contact me if I can be of assistance to you before our next scheduled appointment.  Your next care management appointment is by telephone on [phone] at 1 pm  Telephone follow-up in 1 month  Please call the care guide team at [phone] if you need to cancel, schedule, or reschedule an appointment.   Please call the Suicide and Crisis Lifeline: 988 call the [place]  National Suicide Prevention Lifeline: 870-642-9688 or TTY: (731)482-7198 TTY 615-599-0412) to talk to a trained counselor call 1-800-273-TALK (toll free, 24 hour hotline) go to [facility] [address], [place] [phone]) call the [facility] Crisis Line: [phone] call 911 if you are experiencing a Mental Health or Behavioral Health Crisis or need someone to talk to.  Lyle Rung, BSW, MSW, LCSW Licensed Clinical Social Worker [employer]   [facility] [email or web address] Direct Dial: [phone]

## 2024-07-11 ENCOUNTER — Ambulatory Visit: Payer: Self-pay | Admitting: Gastroenterology

## 2024-07-23 ENCOUNTER — Other Ambulatory Visit: Payer: Self-pay | Admitting: Licensed Clinical Social Worker

## 2024-07-23 NOTE — Patient Outreach (Signed)
 Complex Care Management   Visit Note  07/23/2024  Name:  Diana Spencer MRN: [id number] DOB: Jun 07, 1961  Situation: Referral received for Complex Care Management related to Mental/Behavioral Health diagnosis Grief and Anxiety. I obtained verbal consent from Patient.  Visit completed with Patient  on the phone  Background:   Past Medical History:  Diagnosis Date   Allergies    Allergy    Arthritis    Asthma    Chicken pox    Colon polyps    Diverticulitis    GAD (generalized anxiety disorder)    GERD (gastroesophageal reflux disease)    Hay fever    Hiatal hernia    HLD (hyperlipidemia)    HLP (hyperkeratosis lenticularis perstans)    Hypertension    Kidney stone 06/10/2021   MDD (major depressive disorder)    Seasonal allergies    Sinus problem    UTI (urinary tract infection)     Assessment: Patient Reported Symptoms:  Cognitive Cognitive Status: Able to follow simple commands, Alert and oriented to person, place, and time, Normal speech and language skills Cognitive/Intellectual Conditions Management [RPT]: Behavior Disorders Behavior Disorders: Anxiety and Grief symptoms due to recent loss of spouse   Health Maintenance Behaviors: Annual physical exam Healing Pattern: Average Health Facilitated by: Rest, Stress management  Neurological Neurological Review of Symptoms: Weakness Neurological Management Strategies: Coping strategies, Medication therapy Neurological Self-Management Outcome: 4 (good)  HEENT HEENT Symptoms Reported: No symptoms reported HEENT Management Strategies: Routine screening, Adequate rest HEENT Self-Management Outcome: 4 (good)    Cardiovascular Cardiovascular Symptoms Reported: Fatigue Does patient have uncontrolled Hypertension?: Yes Is patient checking Blood Pressure at home?: Yes Cardiovascular Management Strategies: Medication therapy, Adequate rest Cardiovascular Self-Management Outcome: 4 (good)  Respiratory Respiratory Symptoms  Reported: No symptoms reported Additional Respiratory Details: Denies SOB Respiratory Management Strategies: Adequate rest, Routine screening  Endocrine Endocrine Symptoms Reported: No symptoms reported Is patient diabetic?: No Endocrine Self-Management Outcome: 4 (good)  Gastrointestinal Gastrointestinal Symptoms Reported: No symptoms reported Gastrointestinal Management Strategies: Adequate rest    Genitourinary Genitourinary Symptoms Reported: No symptoms reported Genitourinary Management Strategies: Adequate rest  Integumentary Integumentary Symptoms Reported: No symptoms reported Skin Management Strategies: Routine screening  Musculoskeletal Musculoskelatal Symptoms Reviewed: Weakness Musculoskeletal Management Strategies: Coping strategies, Adequate rest, Routine screening Musculoskeletal Self-Management Outcome: 4 (good)      Psychosocial Psychosocial Symptoms Reported: Report of significant loss, deaths, abandonment, traumatic incidents Behavioral Management Strategies: Adequate rest, Coping strategies, Medication therapy Behavioral Health Self-Management Outcome: 3 (uncertain) Major Change/Loss/Stressor/Fears (CP): Death of a loved one Techniques to Cope with Loss/Stress/Change: Diversional activities, Medication Quality of Family Relationships: helpful, involved Do you feel physically threatened by others?: No    07/23/2024    PHQ2-9 Depression Screening   Little interest or pleasure in doing things Several days  Feeling down, depressed, or hopeless More than half the days  PHQ-2 - Total Score 3  Trouble falling or staying asleep, or sleeping too much Nearly every day  Feeling tired or having little energy More than half the days  Poor appetite or overeating  Several days  Feeling bad about yourself - or that you are a failure or have let yourself or your family down Several days  Trouble concentrating on things, such as reading the newspaper or watching television Not  at all  Moving or speaking so slowly that other people could have noticed.  Or the opposite - being so fidgety or restless that you have been moving around a lot more than usual Not  at all  Thoughts that you would be better off dead, or hurting yourself in some way Not at all  PHQ2-9 Total Score 10  If you checked off any problems, how difficult have these problems made it for you to do your work, take care of things at home, or get along with other people Somewhat difficult  Depression Interventions/Treatment Medication, Community Resources Provided (Referral to [facility] for talk therapy)    There were no vitals filed for this visit.    Medications Reviewed Today     Reviewed by Merlynn Lyle CROME, LCSW (Social Worker) on 07/23/24 at 1256  Med List Status: <None>   Medication Order Taking? Sig Documenting Provider Last Dose Status Informant  albuterol  (VENTOLIN  HFA) 108 (90 Base) MCG/ACT inhaler [id number]  Inhale 1-2 puffs into the lungs every 6 (six) hours as needed for wheezing or shortness of breath. Kuneff, Renee A, DO  Active   amLODipine  (NORVASC ) 5 MG tablet [id number]  Take 1 tablet (5 mg total) by mouth daily. Kuneff, Renee A, DO  Active   budesonide -formoterol  (SYMBICORT ) 160-4.5 MCG/ACT inhaler [id number]  Inhale 2 puffs into the lungs 2 (two) times daily. Kuneff, Renee A, DO  Active   Cholecalciferol (VITAMIN D3 PO) [id number]  Take 5,000 Units by mouth. [provider]  Active   cyclobenzaprine  (FLEXERIL ) 5 MG tablet [id number]  Take 1-2 tablets (5-10 mg total) by mouth 2 (two) times daily as needed for muscle spasms. Brooks, Dana, DO  Active   diazepam  (VALIUM ) 10 MG tablet [id number]  Take 0.5-1 tablets (5-10 mg total) by mouth daily as needed for anxiety. Kuneff, Renee A, DO  Active   dicyclomine  (BENTYL ) 10 MG capsule [id number]  TAKE 1 CAPSULE (10 MG TOTAL) BY MOUTH 4 TIMES A DAY BEFORE MEALS AND AT BEDTIME Nandigam, Kavitha V, MD  Active   esomeprazole  (NEXIUM ) 20 MG capsule  [id number]  Take 1 capsule (20 mg total) by mouth 2 (two) times daily before a meal. Nandigam, Kavitha V, MD  Active   famotidine  (PEPCID ) 20 MG tablet [id number]  Take 1 tablet (20 mg total) by mouth 2 (two) times daily. Nandigam, Kavitha V, MD  Active   fexofenadine  (ALLEGRA ) 180 MG tablet [id number]  Take 180 mg by mouth as needed for allergies or rhinitis. [provider]  Active   Magnesium  250 MG TABS [id number]  Take by mouth. [provider]  Active   meloxicam  (MOBIC ) 15 MG tablet [id number]  Take 1 tablet (15 mg total) by mouth daily. Kuneff, Renee A, DO  Active   ondansetron  (ZOFRAN ) 4 MG tablet [id number]  Take 1 tablet (4 mg total) by mouth every 8 (eight) hours as needed. Kuneff, Renee A, DO  Active   rosuvastatin  (CRESTOR ) 40 MG tablet [id number]  Take 1 tablet (40 mg total) by mouth at bedtime. Kuneff, Renee A, DO  Active   venlafaxine  XR (EFFEXOR -XR) 75 MG 24 hr capsule [id number]  Take 3 capsules (225 mg total) by mouth daily with breakfast. Catherine, Renee A, DO  Active             Recommendation:   PCP Follow-up Specialty provider follow-up [facility] Continue Current Plan of Care  Follow Up Plan:   Telephone follow-up in 1 month  Lyle Merlynn, BSW, MSW, LCSW Licensed Clinical Social Worker [employer]   [email or web address] Direct Dial: [phone]

## 2024-07-23 NOTE — Patient Instructions (Signed)
 Visit Information  Thank you for taking time to visit with me today. Please don't hesitate to contact me if I can be of assistance to you before our next scheduled appointment.  Your next care management appointment is by telephone on 12/18 at 1pm  Telephone follow-up in 1 month  Please call the care guide team at [phone] if you need to cancel, schedule, or reschedule an appointment.   Please call the Suicide and Crisis Lifeline: 988 call the [place]  National Suicide Prevention Lifeline: 302-079-5401 or TTY: 252 636 8483 TTY 425 802 6185) to talk to a trained counselor call 1-800-273-TALK (toll free, 24 hour hotline) go to [facility] [address], [place] [phone]) call the [facility] Crisis Line: [phone] call 911 if you are experiencing a Mental Health or Behavioral Health Crisis or need someone to talk to.  Diana Spencer, BSW, MSW, LCSW Licensed Clinical Social Worker [employer]   [name] Direct Dial: [phone]

## 2024-08-15 ENCOUNTER — Encounter (HOSPITAL_COMMUNITY): Payer: Self-pay | Admitting: Gastroenterology

## 2024-08-15 ENCOUNTER — Encounter (HOSPITAL_COMMUNITY): Admission: RE | Disposition: A | Payer: Self-pay | Attending: Gastroenterology

## 2024-08-15 ENCOUNTER — Ambulatory Visit (HOSPITAL_COMMUNITY)
Admission: RE | Admit: 2024-08-15 | Discharge: 2024-08-15 | Disposition: A | Discharge status: Home / Self Care | Source: Ambulatory Visit | Attending: Gastroenterology | Admitting: Gastroenterology

## 2024-08-15 DIAGNOSIS — K219 Gastro-esophageal reflux disease without esophagitis: Secondary | ICD-10-CM | POA: Diagnosis not present

## 2024-08-15 DIAGNOSIS — R1310 Dysphagia, unspecified: Secondary | ICD-10-CM | POA: Diagnosis not present

## 2024-08-15 HISTORY — PX: ESOPHAGEAL MANOMETRY: SHX5429

## 2024-08-15 SURGERY — MANOMETRY, ESOPHAGUS

## 2024-08-15 MED ORDER — LIDOCAINE VISCOUS HCL 2 % MT SOLN
OROMUCOSAL | Status: AC
  Filled 2024-08-15: qty 15

## 2024-08-15 SURGICAL SUPPLY — 2 items
FACESHIELD LNG OPTICON STERILE (SAFETY) IMPLANT
GLOVE BIO SURGEON STRL SZ8 (GLOVE) ×2 IMPLANT

## 2024-08-15 NOTE — Progress Notes (Signed)
 Esophageal manometry performed per protocol.  Patient tolerated well.

## 2024-08-16 ENCOUNTER — Other Ambulatory Visit: Payer: Self-pay | Admitting: Licensed Clinical Social Worker

## 2024-08-16 NOTE — Patient Instructions (Signed)
 Visit Information  Thank you for taking time to visit with me today. Please don't hesitate to contact me if I can be of assistance to you before our next scheduled appointment.  Our next appointment is by telephone on 09/07/23 at 1 Please call the care guide team at [phone] if you need to cancel or reschedule your appointment.   Following is a copy of your care plan:   Goals Addressed             This Visit's Progress    LCSW [facility] Social Work Care Plan         Current barriers:   Chronic Mental Health needs related to depression, anxiety and grief from losing her spouse in October of 2025. Patient requires Support, Education, Resources, Referrals, Advocacy, and Care Coordination, in order to meet Unmet Mental Health Needs. Patient will implement clinical interventions discussed today to decrease symptoms of grief and increase knowledge and/or ability of: coping skills. Mental Health Concerns and Social Isolation Patient lacks knowledge of available community counseling agencies and resources.  Clinical Goal(s): verbalize understanding of plan for management of Grief, Anxiety, Depression, and Stress and demonstrate a reduction in symptoms. Patient will connect with a provider for ongoing mental health treatment, increase coping skills, healthy habits, self-management skills, and stress reduction        Clinical Interventions:  Assessed patient's previous and current treatment, coping skills, support system and barriers to care. Patient recently lost her daughter to do an overdose. Verbalization of feelings encouraged, motivational interviewing employed Emotional support provided, positive coping strategies explored Self care emphasized Patient worsening grief impacting their ability to function appropriately and carry out daily task. LCSW provided education on relaxation techniques such as meditation, deep breathing, massage, grounding exercises or yoga that can activate the body's  relaxation response and ease symptoms of stress and anxiety. LCSW ask that when pt is struggling with difficult emotions and racing thoughts that they start this relaxation response process. LCSW provided extensive education on healthy coping skills for anxiety. SW used active and reflective listening, validated patient's feelings/concerns, and provided emotional support. Patient will work on implementing appropriate self-care habits into their daily routine such as: staying positive, writing a gratitude list, drinking water, staying active around the house, taking their medications as prescribed, combating negative thoughts or emotions and staying connected with their family and friends. Positive reinforcement provided for this decision to work on this. Motivational Interviewing employed Depression screen reviewed  PHQ2/ PHQ9 completed Mindfulness or Relaxation training provided Active listening / Reflection utilized  Advance Care and HCPOA education provided Emotional Support Provided Problem Solving /Task Center strategies reviewed Provided psychoeducation for mental health needs  Provided brief CBT  Reviewed mental health medications and discussed importance of compliance:  Quality of sleep assessed & Sleep Hygiene techniques promoted  Participation in counseling encouraged  Verbalization of feelings encouraged  Suicidal Ideation/Homicidal Ideation assessed: Patient denies SI/HI  Review resources, discussed options and provided patient information about  Mental Health Resources Inter-disciplinary care team collaboration (see longitudinal plan of care) Jessyca reports ongoing grief symptoms since loss of spouse. She shares that she and her 4 dogs will be going to [facility] tomorrow to visit her father and sister for the holidays. She is hopeful that this trip will lift her spirits. She reports not hearing form [facility] yet in order to get scheduled for individual therapy but is agreeable to  contact them herself within 14 days if her referral has not been addressed  by then. Contact number for [facility] and [facility] care resource information provided to patient today.  Patient has initial therapy appointment for grief support with [facility] on 08/21/24 Patient Goals/Self-Care Activities: Over the next 120 days Attend scheduled medical appointments Utilize healthy coping skills and supportive resources discussed Contact PCP with any questions or concerns Keep 90 percent of counseling appointments Call your insurance provider for more information about your Enhanced Benefits  Check out counseling resources provided  Begin personal counseling with LCSW, to reduce and manage symptoms of Depression and Stress, until well-established with mental health provider Accept all calls from representative at [facility] as an effort to establish ongoing mental health counseling and supportive grief services.  Incorporate into daily practice - relaxation techniques, deep breathing exercises, and mindfulness meditation strategies. Talk about feelings with friends, family members, spiritual advisor, etc. Contact LCSW directly [phone]), if you have questions, need assistance, or if additional social work needs are identified between now and our next scheduled telephone outreach call. Call 988 for mental health hotline/crisis line if needed (24/7 available) Try techniques to reduce symptoms of anxiety/negative thinking (deep breathing, distraction, positive self talk, etc)  - develop a personal safety plan - develop a plan to deal with triggers like holidays, anniversaries - exercise at least 2 to 3 times per week - have a plan for how to handle bad days - journal feelings and what helps to feel better or worse - spend time or talk with others at least 2 to 3 times per week - watch for early signs of feeling worse - begin personal counseling - call and visit an old friend - check out volunteer  opportunities - join a support group - laugh; watch a funny movie or comedian - learn and use visualization or guided imagery - perform a random act of kindness - practice relaxation or meditation daily - start or continue a personal journal - practice positive thinking and self-talk -continue with compliance of taking medication  -identify current effective and ineffective coping strategies.  -implement positive self-talk in care to increase self-esteem, confidence and feelings of control.  -consider alternative and complementary therapy approaches such as meditation, mindfulness or yoga.  -journaling, prayer, worship services, meditation or pastoral counseling.  -increase participation in pleasurable group activities such as hobbies, singing, sports or volunteering).  -consider the use of meditative movement therapy such as tai chi, yoga or qigong.  -start a regular daily exercise program based on tolerance, ability and patient choice to support positive thinking and activity     Managing Loss, Adult People experience loss in many different ways throughout their lives. Events such as moving, changing jobs, and losing friends can create a sense of loss. The loss may be as serious as a major health change, divorce, death of a pet, or death of a loved one. All of these types of loss are likely to create a physical and emotional reaction known as grief. Grief is the result of a major change or an absence of something or someone that you count on. Grief is a normal reaction to loss. A variety of factors can affect your grieving experience, including: The nature of your loss. Your relationship to what or whom you lost. Your understanding of grief and how to manage it. Your support system. How to manage lifestyle changes Keep to your normal routine as much as possible. If you have trouble focusing or doing normal activities, it is acceptable to take some time away from  your normal  routine. Spend time with friends and loved ones. Eat a healthy diet, get plenty of sleep, and rest when you feel tired. How to recognize changes  The way that you deal with your grief will affect your ability to function as you normally do. When grieving, you may experience these changes: Numbness, shock, sadness, anxiety, anger, denial, and guilt. Thoughts about death. Unexpected crying. A physical sensation of emptiness in your stomach. Problems sleeping and eating. Tiredness (fatigue). Loss of interest in normal activities. Dreaming about or imagining seeing the person who died. A need to remember what or whom you lost. Difficulty thinking about anything other than your loss for a period of time. Relief. If you have been expecting the loss for a while, you may feel a sense of relief when it happens. Follow these instructions at home:    Activity Express your feelings in healthy ways, such as: Talking with others about your loss. It may be helpful to find others who have had a similar loss, such as a support group. Writing down your feelings in a journal. Doing physical activities to release stress and emotional energy. Doing creative activities like painting, sculpting, or playing or listening to music. Practicing resilience. This is the ability to recover and adjust after facing challenges. Reading some resources that encourage resilience may help you to learn ways to practice those behaviors. General instructions Be patient with yourself and others. Allow the grieving process to happen, and remember that grieving takes time. It is likely that you may never feel completely done with some grief. You may find a way to move on while still cherishing memories and feelings about your loss. Accepting your loss is a process. It can take months or longer to adjust. Keep all follow-up visits as told by your health care provider. This is important. Where to find support To get support for  managing loss: Ask your health care provider for help and recommendations, such as grief counseling or therapy. Think about joining a support group for people who are managing a loss. Where to find more information You can find more information about managing loss from: American Society of Clinical Oncology: www.cancer.net American Psychological Association: dicetournament.ca Contact a health care provider if: Your grief is extreme and keeps getting worse. You have ongoing grief that does not improve. Your body shows symptoms of grief, such as illness. You feel depressed, anxious, or lonely. Get help right away if: You have thoughts about hurting yourself or others. If you ever feel like you may hurt yourself or others, or have thoughts about taking your own life, get help right away. You can go to your nearest emergency department or call: Your local emergency services (911 in the [place] A suicide crisis helpline, such as the National Suicide Prevention Lifeline at 443-177-9393. This is open 24 hours a day. Summary Grief is the result of a major change or an absence of someone or something that you count on. Grief is a normal reaction to loss. The depth of grief and the period of recovery depend on the type of loss and your ability to adjust to the change and process your feelings. Processing grief requires patience and a willingness to accept your feelings and talk about your loss with people who are supportive. It is important to find resources that work for you and to realize that people experience grief differently. There is not one grieving process that works for everyone in the same way. Be aware that  when grief becomes extreme, it can lead to more severe issues like isolation, depression, anxiety, or suicidal thoughts. Talk with your health care provider if you have any of these issues. This information is not intended to replace advice given to you by your health care provider. Make sure  you discuss any questions you have with your health care provider. Document Revised: 10/20/2018 Document Reviewed: 12/30/2016 Elsevier Patient Education  2020 [employer].          Please call the Suicide and Crisis Lifeline: 988 call the [place]  National Suicide Prevention Lifeline: 647 804 2669 or TTY: 504-147-0758 TTY (409)343-8372) to talk to a trained counselor call 1-800-273-TALK (toll free, 24 hour hotline) go to [facility] [address], [place] [phone]) call 911 if you are experiencing a Mental Health or Behavioral Health Crisis or need someone to talk to.  Patient verbalized understanding of Care plan and visit instructions communicated this visit  Lyle Rung, BSW, MSW, LCSW Licensed Clinical Social Worker [employer]   [email or web address] Direct Dial: [phone]

## 2024-08-16 NOTE — Patient Outreach (Signed)
 Complex Care Management   Visit Note  08/16/2024  Name:  Diana Spencer MRN: [id number] DOB: 1960-11-04  Situation: Referral received for Complex Care Management related to Mental/Behavioral Health diagnosis grief. I obtained verbal consent from Patient.  Visit completed with Patient  on the phone  Background:   Past Medical History:  Diagnosis Date   Allergies    Allergy    Arthritis    Asthma    Chicken pox    Colon polyps    Diverticulitis    GAD (generalized anxiety disorder)    GERD (gastroesophageal reflux disease)    Hay fever    Hiatal hernia    HLD (hyperlipidemia)    HLP (hyperkeratosis lenticularis perstans)    Hypertension    Kidney stone 06/10/2021   MDD (major depressive disorder)    Seasonal allergies    Sinus problem    UTI (urinary tract infection)     Assessment: Patient Reported Symptoms:  Cognitive Cognitive Status: Able to follow simple commands, Alert and oriented to person, place, and time Cognitive/Intellectual Conditions Management [RPT]: Behavior Disorders Behavior Disorders: Anxiety and Grief   Health Maintenance Behaviors: Annual physical exam Healing Pattern: Average Health Facilitated by: Rest, Stress management  Neurological Neurological Review of Symptoms: Weakness Neurological Management Strategies: Medication therapy, Coping strategies Neurological Self-Management Outcome: 4 (good)  HEENT HEENT Symptoms Reported: No symptoms reported HEENT Management Strategies: Routine screening, Adequate rest HEENT Self-Management Outcome: 4 (good) HEENT Comment: Flu Shot helped to prevent any recent illness    Cardiovascular Cardiovascular Symptoms Reported: Fatigue Does patient have uncontrolled Hypertension?: Yes Is patient checking Blood Pressure at home?: No Cardiovascular Management Strategies: Medication therapy, Adequate rest, Coping strategies Cardiovascular Self-Management Outcome: 4 (good)  Respiratory Respiratory Symptoms Reported:  No symptoms reported Additional Respiratory Details: Denies SOB    Endocrine Endocrine Symptoms Reported: No symptoms reported Is patient diabetic?: No    Gastrointestinal Gastrointestinal Symptoms Reported: No symptoms reported      Genitourinary Genitourinary Symptoms Reported: No symptoms reported    Integumentary Integumentary Symptoms Reported: No symptoms reported    Musculoskeletal Musculoskelatal Symptoms Reviewed: Weakness        Psychosocial Psychosocial Symptoms Reported: Report of significant loss, deaths, abandonment, traumatic incidents Behavioral Management Strategies: Adequate rest, Coping strategies, Medication therapy Behavioral Health Self-Management Outcome: 3 (uncertain) Major Change/Loss/Stressor/Fears (CP): Death of a loved one Techniques to Cope with Loss/Stress/Change: Medication, Diversional activities Quality of Family Relationships: involved, helpful, supportive Do you feel physically threatened by others?: No    08/16/2024    PHQ2-9 Depression Screening   Little interest or pleasure in doing things Several days  Feeling down, depressed, or hopeless Several days  PHQ-2 - Total Score 2  Trouble falling or staying asleep, or sleeping too much Several days  Feeling tired or having little energy Nearly every day  Poor appetite or overeating  Several days  Feeling bad about yourself - or that you are a failure or have let yourself or your family down Several days  Trouble concentrating on things, such as reading the newspaper or watching television Not at all  Moving or speaking so slowly that other people could have noticed.  Or the opposite - being so fidgety or restless that you have been moving around a lot more than usual Not at all  Thoughts that you would be better off dead, or hurting yourself in some way Not at all  PHQ2-9 Total Score 8  If you checked off any problems, how difficult have these problems  made it for you to do your work, take care  of things at home, or get along with other people Somewhat difficult  Depression Interventions/Treatment Counseling, [facility] Provided, Medication, Currently on Treatment    There were no vitals filed for this visit.    Medications Reviewed Today     Reviewed by Merlynn Lyle CROME, LCSW (Social Worker) on 08/16/24 at 1341  Med List Status: <None>   Medication Order Taking? Sig Documenting Provider Last Dose Status Informant  albuterol  (VENTOLIN  HFA) 108 (90 Base) MCG/ACT inhaler [id number]  Inhale 1-2 puffs into the lungs every 6 (six) hours as needed for wheezing or shortness of breath. Kuneff, Renee A, DO  Active   amLODipine  (NORVASC ) 5 MG tablet [id number]  Take 1 tablet (5 mg total) by mouth daily. Kuneff, Renee A, DO  Active   budesonide -formoterol  (SYMBICORT ) 160-4.5 MCG/ACT inhaler [id number]  Inhale 2 puffs into the lungs 2 (two) times daily. Kuneff, Renee A, DO  Active   Cholecalciferol (VITAMIN D3 PO) [id number]  Take 5,000 Units by mouth. [provider]  Active   cyclobenzaprine  (FLEXERIL ) 5 MG tablet [id number]  Take 1-2 tablets (5-10 mg total) by mouth 2 (two) times daily as needed for muscle spasms. Brooks, Dana, DO  Active   diazepam  (VALIUM ) 10 MG tablet [id number]  Take 0.5-1 tablets (5-10 mg total) by mouth daily as needed for anxiety. Kuneff, Renee A, DO  Active   dicyclomine  (BENTYL ) 10 MG capsule [id number]  TAKE 1 CAPSULE (10 MG TOTAL) BY MOUTH 4 TIMES A DAY BEFORE MEALS AND AT BEDTIME Nandigam, Kavitha V, MD  Active   esomeprazole  (NEXIUM ) 20 MG capsule [id number]  Take 1 capsule (20 mg total) by mouth 2 (two) times daily before a meal. Nandigam, Kavitha V, MD  Active   famotidine  (PEPCID ) 20 MG tablet [id number]  Take 1 tablet (20 mg total) by mouth 2 (two) times daily. Nandigam, Kavitha V, MD  Active   fexofenadine  (ALLEGRA ) 180 MG tablet [id number]  Take 180 mg by mouth as needed for allergies or rhinitis. [provider]  Active   Magnesium  250 MG  TABS [id number]  Take by mouth. [provider]  Active   meloxicam  (MOBIC ) 15 MG tablet [id number]  Take 1 tablet (15 mg total) by mouth daily. Kuneff, Renee A, DO  Active   ondansetron  (ZOFRAN ) 4 MG tablet [id number]  Take 1 tablet (4 mg total) by mouth every 8 (eight) hours as needed. Kuneff, Renee A, DO  Active   rosuvastatin  (CRESTOR ) 40 MG tablet [id number]  Take 1 tablet (40 mg total) by mouth at bedtime. Kuneff, Renee A, DO  Active   venlafaxine  XR (EFFEXOR -XR) 75 MG 24 hr capsule [id number]  Take 3 capsules (225 mg total) by mouth daily with breakfast. Catherine, Renee A, DO  Active            SDOH Interventions    Flowsheet Row Patient Outreach Telephone from 08/16/2024 in [place] POPULATION HEALTH DEPARTMENT Patient Outreach Telephone from 07/23/2024 in [place] POPULATION HEALTH DEPARTMENT Patient Outreach from 07/04/2024 in [place] POPULATION HEALTH DEPARTMENT Telephone from 07/07/2022 in [facility] Community Care Coordination  SDOH Interventions      Food Insecurity Interventions Intervention Not Indicated -- Intervention Not Indicated Intervention Not Indicated  Housing Interventions -- -- Intervention Not Indicated Intervention Not Indicated  Transportation Interventions Intervention Not Indicated -- Intervention Not Indicated Intervention Not Indicated  Utilities Interventions Intervention Not Indicated Intervention Not Indicated -- Intervention Not  Indicated  Depression Interventions/Treatment  Counseling, [facility] Provided, Medication, Currently on Treatment Medication, Community Resources Provided  [employer] to [employer] for talk therapy] Medication --  Financial Strain Interventions -- Intervention Not Indicated [facility] Provided --  Physical Activity Interventions -- -- Intervention Not Indicated --  Stress Interventions [facility] Provided, Education Officer, Environmental Provided, Other (Comment)  [[facility] Care  Resource] [facility] Provided --  Health Literacy Interventions -- Intervention Not Indicated -- --    Recommendation:   PCP Follow-up Continue Current Plan of Care  Follow Up Plan:   Telephone follow-up in 1 month  Lyle Rung, BSW, MSW, [employer] Licensed Clinical Social Worker [employer]   [email or web address] Direct Dial: [phone]

## 2024-08-17 ENCOUNTER — Encounter (HOSPITAL_COMMUNITY): Payer: Self-pay | Admitting: Gastroenterology

## 2024-08-21 ENCOUNTER — Ambulatory Visit (HOSPITAL_COMMUNITY)

## 2024-08-21 DIAGNOSIS — F4323 Adjustment disorder with mixed anxiety and depressed mood: Secondary | ICD-10-CM
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
## 2024-08-27 ENCOUNTER — Encounter (HOSPITAL_COMMUNITY): Payer: Self-pay

## 2024-08-27 ENCOUNTER — Ambulatory Visit (HOSPITAL_COMMUNITY)

## 2024-08-27 DIAGNOSIS — F4323 Adjustment disorder with mixed anxiety and depressed mood: Secondary | ICD-10-CM

## 2024-08-27 NOTE — Progress Notes (Signed)
 "  THERAPIST PROGRESS NOTE  Session Time: 11:32 AM - 12:34 AM - 62 minutes  Participation Level: Active  Behavioral Response: CasualAlertSad  Type of Therapy: Individual Therapy  Treatment Goals addressed:       Active     Anxiety     LTG: Diana Spencer will score less than 5 on the Generalized Anxiety Disorder 7 Scale (GAD-7)  (Initial)     Start:  08/27/24    Expected End:  08/26/25         STG: Report a decrease in anxiety symptoms as evidenced by an overall reduction in anxiety score by a minimum of 25% on the Generalized Anxiety Disorder Scale (GAD-7) (Initial)     Start:  08/27/24    Expected End:  08/26/25         STG: Diana Spencer will report at least 2 daily activities that promote the following: increasing coping skills, developing emotional resilience, restore social/emotional functioning or increase self-care. (Initial)     Start:  08/27/24    Expected End:  08/26/25         Review results of GAD-7 with Diana Spencer to track progress     Start:  08/27/24         Create a weekly activity schedule     Start:  08/27/24         Perform motivational interviewing regarding use of tools     Start:  08/27/24         Perform motivational interviewing regarding physical activity     Start:  08/27/24         Continue cognitive-behavioral therapy for developing healthier responses to her losses and working to change negative thought patterns and behaviors.     Start:  08/27/24            ProgressTowards Goals: Initial  Interventions: CBT, Motivational Interviewing, Solution Focused, Supportive, and Reframing  Summary: Diana Spencer is a 63 y.o. female who presents with continued adjustment reactions to the recent death of her spouse.  She continues to experience sadness, anxiety, feeling overwhelmed and unsure about her future.  She utilizes alcohol and cannabis gummies daily for relief of feelings, but feels this is unhealthy for her.  She experiences feelings of guilt  and low self esteem and wonders daily if her husband knew and accepted that he was dying.  She wonders if she is grieving in the correct way.  She wonders if she made bad decisions.  Works is helpful but she is having difficulty with focus and concentration at work.. She is exploring grief groups and possible church attendance, but has not made decisions.  She is concerned about financial planning now that she only has her own income.  Denies suicidal or homicidal ideations, but admits to crying and trying to avoid feelings.  Diana Spencer was intermittently tearful, but stated at the end this was helpful.  Suicidal/Homicidal: Nowithout intent/plan  Therapist Response: Discussed treatment plan and agreed upon goals.  Diana Spencer has practiced some relaxation responses via YouTube as we discussed at the initial session to create relaxation in the body.  Therapist assisted Diana Spencer to process and reframe some of her negative self-talk around the death of her husband.  Discuss and normalized her feelings of grief and not knowing how to grieve the right way.  Discussed concerns about utilizing alcohol and cannabis gummies daily, therapist recommended she begin to practice additional coping and work towards replacing unhelpful behaviors with more helpful coping such as box breathing for  anxiety and mindfulness exercises for worry and ruminations.  Agreed to practice these with a goal towards replacing some of the substance related coping with more healthy coping.  Will review again in 2 weeks.  Plan: Return again in 2 weeks.  Diagnosis: Adjustment disorder with mixed anxiety and depressed mood  Collaboration of Care: None Needed at this visit  Patient/Guardian was advised Release of Information must be obtained prior to any record release in order to collaborate their care with an outside provider. Patient/Guardian was advised if they have not already done so to contact the registration department to sign all necessary  forms in order for us  to release information regarding their care.   Consent: Patient/Guardian gives verbal consent for treatment and assignment of benefits for services provided during this visit. Patient/Guardian expressed understanding and agreed to proceed.   Diana Ferrari, LCSW 08/27/2024  "

## 2024-08-29 DIAGNOSIS — R1310 Dysphagia, unspecified: Secondary | ICD-10-CM

## 2024-09-06 ENCOUNTER — Other Ambulatory Visit: Payer: Self-pay | Admitting: Licensed Clinical Social Worker

## 2024-09-06 NOTE — Patient Instructions (Signed)
 Diana Spencer - I am sorry I was unable to follow up with you successfully today. We have rescheduled this follow up appointment for 09/20/24 at 2 pm. I work with Catherine Fuller A, DO and am calling to support your healthcare needs. Please contact me at [phone] at your earliest convenience. I look forward to speaking with you soon.   Thank you,  Lyle Rung, BSW, MSW, LCSW Licensed Clinical Social Worker [employer]   [email or web address] Direct Dial: [phone]

## 2024-09-06 NOTE — Patient Outreach (Signed)
 Complex Care Management   Visit Note  09/06/2024  Name:  Diana Spencer MRN: [id number] DOB: 06-30-1961  Situation: Referral received for Complex Care Management related to Mental/Behavioral Health diagnosis grief. I obtained verbal consent from Patient.  Visit completed with Patient  on the phone. Patient reports that she is at work right now and does not have the privacy to discuss her behavioral health updates and is agreeable to rescheduling this follow up appointment to 09/20/24 at 2pm.  Background:   Past Medical History:  Diagnosis Date   Allergies    Allergy    Arthritis    Asthma    Chicken pox    Colon polyps    Diverticulitis    GAD (generalized anxiety disorder)    GERD (gastroesophageal reflux disease)    Hay fever    Hiatal hernia    HLD (hyperlipidemia)    HLP (hyperkeratosis lenticularis perstans)    Hypertension    Kidney stone 06/10/2021   MDD (major depressive disorder)    Seasonal allergies    Sinus problem    UTI (urinary tract infection)    Recommendation:   PCP Follow-up Continue Current Plan of Care Continue talk therapy with [facility]  Follow Up Plan:   Telephone follow-up in 1 month  Lyle Rung, BSW, MSW, LCSW Licensed Clinical Social Worker [employer] [facility]   [email or web address] Direct Dial: [phone]

## 2024-09-11 ENCOUNTER — Ambulatory Visit (HOSPITAL_COMMUNITY)

## 2024-09-11 DIAGNOSIS — F4323 Adjustment disorder with mixed anxiety and depressed mood: Secondary | ICD-10-CM

## 2024-09-13 ENCOUNTER — Encounter (HOSPITAL_COMMUNITY): Payer: Self-pay

## 2024-09-13 NOTE — Progress Notes (Signed)
" ° °  THERAPIST PROGRESS NOTE  Virtual Visit via Video Note  I connected with Joen Clause on 09/12/2023 at  1:00 PM EST by a video enabled telemedicine application and verified that I am speaking with the correct person using two identifiers.  Location: Patient: home  Provider: office   I discussed the limitations of evaluation and management by telemedicine and the availability of in person appointments. The patient expressed understanding and agreed to proceed.    I discussed the assessment and treatment plan with the patient. The patient was provided an opportunity to ask questions and all were answered. The patient agreed with the plan and demonstrated an understanding of the instructions.   The patient was advised to call back or seek an in-person evaluation if the symptoms worsen or if the condition fails to improve as anticipated.  I provided 52 minutes of non-face-to-face time during this encounter.   Lauraine Ferrari, LCSW   Session Time: 1:00 PM - 1:52 PM  Participation Level: Active  Behavioral Response: CasualAlertAnxious  Type of Therapy: Individual Therapy  Treatment Goals addressed:  Developing coping skills, processing emotions surrounding grief and loss  ProgressTowards Goals: Progressing  Interventions: CBT, Motivational Interviewing, Strength-based, and Supportive  Summary: Mallorey Odonell is a 64 y.o. female who presents with continued adjustment reactions to the recent death of her spouse. She continues to experience sadness, anxiety, feeling overwhelmed and unsure about her future. She utilizes alcohol and cannabis gummies daily for relief of feelings, but feels this is unhealthy for her.  Today she continues to deny any suicidal or homicidal thoughts.  She recently went out to an event with local widows.  She has been working on understanding her new financial situation and reports she is starting to feel comfortable with her financial decisions.  She reports she  has started to decrease her alcohol use from 3-4 drinks/day to 1-2 per day and is feeling better as a result.  She is still struggling a bit with focus at work, worry and guilt and low energy on days off.  She reports her housework is piling up but she isn't able to accomplish all she wants. Sleep is slightly improved.   Suicidal/Homicidal: Nowithout intent/plan  Therapist Response: Therapist provided a supportive environment to discuss thoughts feelings and behaviors.  Used CBT to challenge some negative self-assessments around her ability to cope with her grief.  Also provided support and problem solving strategies around some of her areas of concern.  Ulyssa expressed gratitude for the support.  Plan: Return again in 2 weeks.  Diagnosis: F43.23 Adjustment disorder with mixed anxiety and depressed mood.  Collaboration of Care: Not needed at this visit  Patient/Guardian was advised Release of Information must be obtained prior to any record release in order to collaborate their care with an outside provider. Patient/Guardian was advised if they have not already done so to contact the registration department to sign all necessary forms in order for us  to release information regarding their care.   Consent: Patient/Guardian gives verbal consent for treatment and assignment of benefits for services provided during this visit. Patient/Guardian expressed understanding and agreed to proceed.   Lauraine Ferrari, LCSW 09/13/2024  "

## 2024-09-20 ENCOUNTER — Telehealth: Payer: Self-pay | Admitting: Licensed Clinical Social Worker

## 2024-09-26 ENCOUNTER — Ambulatory Visit (HOSPITAL_COMMUNITY)

## 2024-09-26 DIAGNOSIS — F4323 Adjustment disorder with mixed anxiety and depressed mood: Secondary | ICD-10-CM

## 2024-09-26 NOTE — Progress Notes (Signed)
 "  THERAPIST PROGRESS NOTE Virtual Visit via Video Note  I connected with Joen Clause on 09/26/24 at  1:00 PM EST by a video enabled telemedicine application and verified that I am speaking with the correct person using two identifiers.  Location: Patient: home Provider: office   I discussed the limitations of evaluation and management by telemedicine and the availability of in person appointments. The patient expressed understanding and agreed to proceed.    I discussed the assessment and treatment plan with the patient. The patient was provided an opportunity to ask questions and all were answered. The patient agreed with the plan and demonstrated an understanding of the instructions.   The patient was advised to call back or seek an in-person evaluation if the symptoms worsen or if the condition fails to improve as anticipated.  I provided 56 minutes of non-face-to-face time during this encounter.   Lauraine Ferrari, LCSW   Session Time: 1:00 PM - 1:56 PM  Participation Level: Active  Behavioral Response: CasualAlertDysphoric  Type of Therapy: Individual Therapy  Treatment Goals addressed:  Active     Anxiety     LTG: Evoleth will score less than 5 on the Generalized Anxiety Disorder 7 Scale (GAD-7)  (Progressing)     Start:  08/27/24    Expected End:  08/26/25         STG: Report a decrease in anxiety symptoms as evidenced by an overall reduction in anxiety score by a minimum of 25% on the Generalized Anxiety Disorder Scale (GAD-7) (Progressing)     Start:  08/27/24    Expected End:  08/26/25         STG: Kemara will report at least 2 daily activities that promote the following: increasing coping skills, developing emotional resilience, restore social/emotional functioning or increase self-care. (Progressing)     Start:  08/27/24    Expected End:  08/26/25         Review results of GAD-7 with Joen to track progress     Start:  08/27/24         Create a weekly  activity schedule     Start:  08/27/24         Perform motivational interviewing regarding use of tools     Start:  08/27/24         Perform motivational interviewing regarding physical activity     Start:  08/27/24         Continue cognitive-behavioral therapy for developing healthier responses to her losses and working to change negative thought patterns and behaviors.     Start:  08/27/24            ProgressTowards Goals: Progressing  Interventions: CBT, Motivational Interviewing, Supportive, and Reframing  Summary: Luan Urbani is a 64 y.o. female who presents with continued adjustment reactions to the recent death of her spouse. She continues to experience sadness, anxiety, feeling overwhelmed and unsure about her future. She reports continued decrease of alcohol use, maybe one hard cider with dinner and a few drinks over the weekend.  She was able to utilize the strategies we discussed at the last session to begin to do more housework and this has helped her feel more productive.  Elenor denies any suicidal or homicidal thoughts.  We discussed various topics related to understanding her new role and identity in her life.  Sherri is planning to meet with a group of local widows over the weekend weather permitting.  She has also developed a  spiritual practice with some of her longtime friends where they meet by phone to discuss bad habits and thinking patterns that no longer serve them.  We spent some time in session today talking through some thinking patterns and relationship behaviors that Sommer has developed because of past trauma that she no longer wants to engage in.  She reveals that in the past she has had a hard shell and was not very empathetic with others.  She talked about this being one of the greatest gifts her husband gave her was to allow her to be herself and let her guard down and she was able to be more kind and loving and was hard with him and she would like for  that to continue.  We discussed some ways to reframe some of her thinking about this which reflected negatively on her self as ways that she developed to cope with things that happened to her that now she no longer needs.  At the end of the session she began talking about relationship with her sister that has been estranged for many years that she does not know what to do with.  We discussed some values that she has with regards to this relationship and other family.  She has been feeling upset that her sister has not contacted her with expressions of sympathy following the death of her husband.  Therapist encouraged Grasiela to think about her values for the relationship and use them to guide her actions rather than reacting to feelings.  Suicidal/Homicidal: Nowithout intent/plan  Therapist Response: Therapist used motivational interviewing strategies including summarization reflecting and asking open-ended questions to engage Sharri and elicit positive change.  Therapist provided supportive environment and encouraged Jennetta to express herself fully.  We continue to discuss the connection between thoughts feelings and behaviors especially related to shares changing roles and relationships.  Plan: Return again in 2 weeks.  Diagnosis: Adjustment disorder with mixed anxiety and depressed mood  Collaboration of Care: not needed at this visit.  Patient/Guardian was advised Release of Information must be obtained prior to any record release in order to collaborate their care with an outside provider. Patient/Guardian was advised if they have not already done so to contact the registration department to sign all necessary forms in order for us  to release information regarding their care.   Consent: Patient/Guardian gives verbal consent for treatment and assignment of benefits for services provided during this visit. Patient/Guardian expressed understanding and agreed to proceed.   Lauraine Ferrari,  LCSW 09/26/2024  "

## 2024-10-08 ENCOUNTER — Telehealth: Payer: Self-pay | Admitting: Licensed Clinical Social Worker

## 2024-10-10 ENCOUNTER — Ambulatory Visit (HOSPITAL_COMMUNITY)

## 2025-01-01 ENCOUNTER — Encounter: Admitting: Family Medicine
